# Patient Record
Sex: Female | Born: 1945 | Race: Black or African American | Hispanic: No | Marital: Single | Smoking: Never smoker
Health system: Southern US, Community
[De-identification: ages and names within clinical notes are randomized; demographics above are authoritative.]

## PROBLEM LIST (undated history)

## (undated) DIAGNOSIS — IMO0002 Reserved for concepts with insufficient information to code with codable children: Secondary | ICD-10-CM

## (undated) DIAGNOSIS — M329 Systemic lupus erythematosus, unspecified: Secondary | ICD-10-CM

## (undated) DIAGNOSIS — N186 End stage renal disease: Secondary | ICD-10-CM

## (undated) DIAGNOSIS — D649 Anemia, unspecified: Secondary | ICD-10-CM

---

## 1984-06-08 HISTORY — PX: ABDOMINAL HYSTERECTOMY: SHX81

## 2017-02-03 ENCOUNTER — Other Ambulatory Visit: Payer: Self-pay

## 2017-02-03 DIAGNOSIS — N185 Chronic kidney disease, stage 5: Secondary | ICD-10-CM

## 2017-02-18 ENCOUNTER — Encounter: Payer: Self-pay | Admitting: Vascular Surgery

## 2017-03-12 ENCOUNTER — Ambulatory Visit (HOSPITAL_COMMUNITY)
Admission: RE | Admit: 2017-03-12 | Discharge: 2017-03-12 | Disposition: A | Discharge status: Home / Self Care | Payer: Medicare Other | Source: Ambulatory Visit | Attending: Vascular Surgery | Admitting: Vascular Surgery

## 2017-03-12 ENCOUNTER — Encounter: Payer: Self-pay | Admitting: Vascular Surgery

## 2017-03-12 ENCOUNTER — Ambulatory Visit (INDEPENDENT_AMBULATORY_CARE_PROVIDER_SITE_OTHER): Payer: Medicare Other | Admitting: Vascular Surgery

## 2017-03-12 VITALS — BP 93/57 | HR 82 | Resp 16

## 2017-03-12 DIAGNOSIS — I70298 Other atherosclerosis of native arteries of extremities, other extremity: Secondary | ICD-10-CM | POA: Diagnosis not present

## 2017-03-12 DIAGNOSIS — N185 Chronic kidney disease, stage 5: Secondary | ICD-10-CM

## 2017-03-12 DIAGNOSIS — N186 End stage renal disease: Secondary | ICD-10-CM | POA: Diagnosis not present

## 2017-03-12 DIAGNOSIS — Z01818 Encounter for other preprocedural examination: Secondary | ICD-10-CM | POA: Diagnosis not present

## 2017-03-12 DIAGNOSIS — Z992 Dependence on renal dialysis: Secondary | ICD-10-CM | POA: Diagnosis not present

## 2017-03-12 NOTE — Progress Notes (Signed)
Requested by:  Corliss Parish, MD [address] [place], [place] [address]  Reason for consultation: New access   History of Present Illness   Abigail Buck is a 71 y.o. (1945-08-13) female who presents for evaluation for permanent access.  The patient is right hand dominant.  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  Previous central venous cannulation procedures include: LIJV TDC.  The patient has never had a PPM placed.   Past Medical History 1. HTN 2. CAD 3. HLD 4. CHF 5. CVA 6. Anxiety 7. Dementia 8. Lupus 9. ESRD 10. Hyperparathyroidism  Past Surgical History 1. PCI 2. Hysterectomy 3. LUA AVG 4. LUA AVG excision  Social History   Social History  . Marital status: Single    Spouse name: N/A  . Number of children: N/A  . Years of education: N/A   Occupational History  . Not on file.   Social History Main Topics  . Smoking status: Never Smoker  . Smokeless tobacco: Never Used  . Alcohol use No  . Drug use: No  . Sexual activity: No   Other Topics Concern  . Not on file   Social History Narrative  . No narrative on file    Family History  Problem Relation Age of Onset  . Family history unknown: Yes    Current Outpatient Prescriptions  Medication Sig Dispense Refill  . aspirin EC 81 MG tablet Take 81 mg by mouth daily.    . carvedilol (COREG) 6.25 MG tablet Take 6.25 mg by mouth 2 (two) times daily with a meal.    . cinacalcet (SENSIPAR) 30 MG tablet Take 30 mg by mouth daily with supper.    . clopidogrel (PLAVIX) 75 MG tablet Take 75 mg by mouth daily.    . ergocalciferol (VITAMIN D2) 50000 units capsule Take 50,000 Units by mouth once a week.    . hydroxychloroquine (PLAQUENIL) 200 MG tablet Take by mouth daily.    . midodrine (PROAMATINE) 10 MG tablet Take 10 mg by mouth daily.    . sevelamer carbonate (RENVELA) 800 MG tablet Take 800 mg by mouth 3 (three) times daily with meals.      No current facility-administered medications for this visit.     Allergies  Allergen Reactions  . Morphine Other (See Comments)    Opposite effect makes her agressive    REVIEW OF SYSTEMS (negative unless checked):   Cardiac:  []  Chest pain or chest pressure? [x]  Shortness of breath upon activity? []  Shortness of breath when lying flat? []  Irregular heart rhythm?  Vascular:  []  Pain in calf, thigh, or hip brought on by walking? []  Pain in feet at night that wakes you up from your sleep? []  Blood clot in your veins? []  Leg swelling?  Pulmonary:  []  Oxygen at home? []  Productive cough? []  Wheezing?  Neurologic:  [x]  Sudden weakness in arms or legs? []  Sudden numbness in arms or legs? []  Sudden onset of difficult speaking or slurred speech? []  Temporary loss of vision in one eye? []  Problems with dizziness?  Gastrointestinal:  []  Blood in stool? []  Vomited blood?  Genitourinary:  []  Burning when urinating? []  Blood in urine?  Psychiatric:  []  Major depression  Hematologic:  []  Bleeding problems? []  Problems with blood clotting?  Dermatologic:  []  Rashes or ulcers?  Constitutional:  []  Fever or chills?  Ear/Nose/Throat:  []  Change in hearing? []  Nose bleeds? []  Sore  throat?  Musculoskeletal:  []  Back pain? []  Joint pain? []  Muscle pain?   Physical Examination     Vitals:   03/12/17 1318  BP: (!) 93/57  Pulse: 82  Resp: 16  SpO2: 97%   There is no height or weight on file to calculate BMI.  General Alert, period of lucidity mixed with confusion, WD, Ill appearing, contracted in wheelchair  Head [place]/AT,    Ear/Nose/ Throat Hearing grossly intact, nares without erythema or drainage, oropharynx without Erythema or Exudate, Mallampati score: 3,   Eyes PERRLA, EOMI,    Neck Supple, mid-line trachea,    Pulmonary Sym exp, good B air movt, CTA B  Cardiac Irregularly, irregular rate and rhythm, no Murmurs, No rubs, No S3,S4  Vascular Vessel  Right Left  Radial Palpable Palpable  Brachial Palpable Palpable  Carotid Palpable, No Bruit Palpable, No Bruit  Aorta Not palpable N/A  Femoral Not palpable due to position in wheelchair Not palpable due to position in wheelchair  Popliteal Not palpable Not palpable  PT Not palpable Not palpable  DP Not palpable Not palpable    Gastro- intestinal soft, non-distended, non-tender to palpation, No guarding or rebound, no HSM, no masses, no CVAT B, No palpable prominent aortic pulse,    Musculo- skeletal R arm M/S 5/5, movements are dyscoordinated, Both legs partially contracted, L hand clawing with some mild contracture of L elbow  Neurologic Cranial nerves 2-12 grossly intact, Pain and light touch intact in extremities, Motor exam as listed above  Psychiatric Judgement not intact, Mood & affect consistent with cognitive dysfunction  Dermatologic See M/S exam for extremity exam, No rashes otherwise noted  Lymphatic  Palpable lymph nodes: None     Non-invasive Vascular Imaging   RUE Vein Mapping  (Date: 03/12/2017):   R arm: acceptable vein conduits include marginal upper arm basilic vein  LRUE Doppler (Date: 03/12/2017):   R arm:   Brachial: bi, 3.9 mm  Radial: mono, 2.0 mm  Ulnar: mono, 1.0 mm   Outside Studies/Documentation   20 pages of outside documents were reviewed including: outside hospital and nephrology charts.   Medical Decision Making   Abigail Buck is a 71 y.o. female who presents with end stage renal disease requiring HD, s/p CVA, dementia, debilitation   Based on vein mapping and examination, this patient's permanent access options include: R staged BVT.  I had an extensive discussion with this patient in regards to the nature of access surgery, including risk, benefits, and alternatives.    The patient is aware that the risks of access surgery include but are not limited to: bleeding, infection, steal syndrome, nerve damage, ischemic monomelic  neuropathy, failure of access to mature, complications related to venous hypertension, and possible need for additional access procedures in the future.  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  Given this is her only functioning hand, this obviously is a significant risk for deterioration of quality of life in the event clinically significant steal syndrome develops in the right hand.  The patient has decided to continued hemodialysis via her left internal jugular vein tunneled dialysis catheter.  She is aware of the increased rate of mortality of hemodialysis via a tunneled dialysis catheter.   Adele Barthel, MD, FACS [facility] Office: [phone] Pager: [phone]  03/12/2017, 4:29 PM

## 2017-10-04 ENCOUNTER — Inpatient Hospital Stay
Admission: AD | Admit: 2017-10-04 | Payer: Self-pay | Source: Other Acute Inpatient Hospital | Admitting: Internal Medicine

## 2017-10-08 HISTORY — PX: PACEMAKER IMPLANT: EP1218

## 2018-06-21 ENCOUNTER — Observation Stay (HOSPITAL_COMMUNITY)
Admission: EM | Admit: 2018-06-21 | Discharge: 2018-06-22 | Disposition: A | Discharge status: Skilled Nursing Facility | Payer: Medicare Other | Attending: Oncology | Admitting: Oncology

## 2018-06-21 ENCOUNTER — Encounter (HOSPITAL_COMMUNITY): Payer: Self-pay

## 2018-06-21 ENCOUNTER — Other Ambulatory Visit: Payer: Self-pay

## 2018-06-21 ENCOUNTER — Emergency Department (HOSPITAL_COMMUNITY): Payer: Medicare Other

## 2018-06-21 DIAGNOSIS — N186 End stage renal disease: Secondary | ICD-10-CM

## 2018-06-21 DIAGNOSIS — R079 Chest pain, unspecified: Secondary | ICD-10-CM | POA: Diagnosis not present

## 2018-06-21 DIAGNOSIS — R778 Other specified abnormalities of plasma proteins: Secondary | ICD-10-CM | POA: Insufficient documentation

## 2018-06-21 DIAGNOSIS — I471 Supraventricular tachycardia: Secondary | ICD-10-CM | POA: Insufficient documentation

## 2018-06-21 DIAGNOSIS — M329 Systemic lupus erythematosus, unspecified: Secondary | ICD-10-CM | POA: Insufficient documentation

## 2018-06-21 DIAGNOSIS — R072 Precordial pain: Secondary | ICD-10-CM | POA: Diagnosis not present

## 2018-06-21 DIAGNOSIS — Z79899 Other long term (current) drug therapy: Secondary | ICD-10-CM | POA: Diagnosis not present

## 2018-06-21 DIAGNOSIS — Z7982 Long term (current) use of aspirin: Secondary | ICD-10-CM | POA: Diagnosis not present

## 2018-06-21 DIAGNOSIS — Z992 Dependence on renal dialysis: Secondary | ICD-10-CM | POA: Insufficient documentation

## 2018-06-21 DIAGNOSIS — I69320 Aphasia following cerebral infarction: Secondary | ICD-10-CM

## 2018-06-21 DIAGNOSIS — I700 Atherosclerosis of aorta: Secondary | ICD-10-CM | POA: Insufficient documentation

## 2018-06-21 DIAGNOSIS — I4439 Other atrioventricular block: Secondary | ICD-10-CM

## 2018-06-21 DIAGNOSIS — Z950 Presence of cardiac pacemaker: Secondary | ICD-10-CM

## 2018-06-21 DIAGNOSIS — I959 Hypotension, unspecified: Secondary | ICD-10-CM | POA: Diagnosis not present

## 2018-06-21 DIAGNOSIS — Z993 Dependence on wheelchair: Secondary | ICD-10-CM

## 2018-06-21 DIAGNOSIS — R7989 Other specified abnormal findings of blood chemistry: Secondary | ICD-10-CM

## 2018-06-21 DIAGNOSIS — I442 Atrioventricular block, complete: Secondary | ICD-10-CM | POA: Diagnosis not present

## 2018-06-21 DIAGNOSIS — I452 Bifascicular block: Secondary | ICD-10-CM

## 2018-06-21 DIAGNOSIS — F0390 Unspecified dementia without behavioral disturbance: Secondary | ICD-10-CM | POA: Insufficient documentation

## 2018-06-21 DIAGNOSIS — R4701 Aphasia: Secondary | ICD-10-CM | POA: Insufficient documentation

## 2018-06-21 DIAGNOSIS — R0600 Dyspnea, unspecified: Secondary | ICD-10-CM | POA: Diagnosis present

## 2018-06-21 DIAGNOSIS — R0789 Other chest pain: Secondary | ICD-10-CM | POA: Diagnosis not present

## 2018-06-21 DIAGNOSIS — I120 Hypertensive chronic kidney disease with stage 5 chronic kidney disease or end stage renal disease: Secondary | ICD-10-CM | POA: Insufficient documentation

## 2018-06-21 DIAGNOSIS — IMO0002 Reserved for concepts with insufficient information to code with codable children: Secondary | ICD-10-CM | POA: Insufficient documentation

## 2018-06-21 DIAGNOSIS — I495 Sick sinus syndrome: Secondary | ICD-10-CM

## 2018-06-21 DIAGNOSIS — Z66 Do not resuscitate: Secondary | ICD-10-CM | POA: Insufficient documentation

## 2018-06-21 DIAGNOSIS — Z8673 Personal history of transient ischemic attack (TIA), and cerebral infarction without residual deficits: Secondary | ICD-10-CM | POA: Diagnosis not present

## 2018-06-21 DIAGNOSIS — Z885 Allergy status to narcotic agent status: Secondary | ICD-10-CM

## 2018-06-21 DIAGNOSIS — I69351 Hemiplegia and hemiparesis following cerebral infarction affecting right dominant side: Secondary | ICD-10-CM

## 2018-06-21 DIAGNOSIS — Z7902 Long term (current) use of antithrombotics/antiplatelets: Secondary | ICD-10-CM

## 2018-06-21 DIAGNOSIS — I480 Paroxysmal atrial fibrillation: Secondary | ICD-10-CM

## 2018-06-21 DIAGNOSIS — R110 Nausea: Secondary | ICD-10-CM | POA: Diagnosis present

## 2018-06-21 DIAGNOSIS — I4510 Unspecified right bundle-branch block: Secondary | ICD-10-CM | POA: Insufficient documentation

## 2018-06-21 HISTORY — DX: Reserved for concepts with insufficient information to code with codable children: IMO0002

## 2018-06-21 HISTORY — DX: Systemic lupus erythematosus, unspecified: M32.9

## 2018-06-21 LAB — I-STAT CHEM 8, ED
BUN: 15 mg/dL (ref 8–23)
CALCIUM ION: 0.92 mmol/L — AB (ref 1.15–1.40)
CREATININE: 5.3 mg/dL — AB (ref 0.44–1.00)
Chloride: 101 mmol/L (ref 98–111)
GLUCOSE: 95 mg/dL (ref 70–99)
HCT: 44 % (ref 36.0–46.0)
Hemoglobin: 15 g/dL (ref 12.0–15.0)
Potassium: 4.3 mmol/L (ref 3.5–5.1)
Sodium: 135 mmol/L (ref 135–145)
TCO2: 30 mmol/L (ref 22–32)

## 2018-06-21 LAB — I-STAT TROPONIN, ED: Troponin i, poc: 0.19 ng/mL (ref 0.00–0.08)

## 2018-06-21 LAB — TROPONIN I: TROPONIN I: 0.07 ng/mL — AB (ref <0.03)

## 2018-06-21 MED ORDER — ACETAMINOPHEN 650 MG RE SUPP: 650.0000 mg | Freq: Four times a day (QID) | RECTAL | Status: DC | PRN

## 2018-06-21 MED ORDER — HYDROXYCHLOROQUINE SULFATE 200 MG PO TABS
200.0000 mg | ORAL_TABLET | Freq: Every day | ORAL | Status: DC
  Administered 2018-06-22: 200 mg via ORAL
  Filled 2018-06-21: qty 1

## 2018-06-21 MED ORDER — HEPARIN SODIUM (PORCINE) 5000 UNIT/ML IJ SOLN
5000.0000 [IU] | Freq: Three times a day (TID) | INTRAMUSCULAR | Status: DC
  Administered 2018-06-21 – 2018-06-22 (×2): 5000 [IU] via SUBCUTANEOUS
  Filled 2018-06-21 (×2): qty 1

## 2018-06-21 MED ORDER — CLOPIDOGREL BISULFATE 75 MG PO TABS
75.0000 mg | ORAL_TABLET | Freq: Every day | ORAL | Status: DC
  Administered 2018-06-22: 75 mg via ORAL
  Filled 2018-06-21: qty 1

## 2018-06-21 MED ORDER — SEVELAMER CARBONATE 800 MG PO TABS
800.0000 mg | ORAL_TABLET | Freq: Three times a day (TID) | ORAL | Status: DC
  Administered 2018-06-22 (×2): 800 mg via ORAL
  Filled 2018-06-21 (×3): qty 1

## 2018-06-21 MED ORDER — ACETAMINOPHEN 325 MG PO TABS: 650.0000 mg | ORAL_TABLET | Freq: Four times a day (QID) | ORAL | Status: DC | PRN

## 2018-06-21 MED ORDER — CARVEDILOL 12.5 MG PO TABS: 6.2500 mg | ORAL_TABLET | Freq: Two times a day (BID) | ORAL | Status: DC

## 2018-06-21 MED ORDER — CINACALCET HCL 30 MG PO TABS: 30.0000 mg | ORAL_TABLET | Freq: Every day | ORAL | Status: DC

## 2018-06-21 MED ORDER — VITAMIN D (ERGOCALCIFEROL) 1.25 MG (50000 UNIT) PO CAPS: 50000.0000 [IU] | ORAL_CAPSULE | ORAL | Status: DC

## 2018-06-21 MED ORDER — ASPIRIN EC 81 MG PO TBEC
81.0000 mg | DELAYED_RELEASE_TABLET | Freq: Every day | ORAL | Status: DC
  Administered 2018-06-22: 81 mg via ORAL
  Filled 2018-06-21: qty 1

## 2018-06-21 NOTE — ED Provider Notes (Signed)
[facility]PART[facility] Provider Note   CSN: [id number] Arrival date & time: 06/21/18  1136     History   Chief Complaint Chief Complaint  Patient presents with  . Chest Pain    HPI Abigail Buck is a 73 y.o. female with a hx of ESRD on dialysis T/R/Sat, lupus, dementia, HTN, and pacemaker in place who presents to the ED via EMS for chest pain which started shortly PTA. Patient states she completed her dialysis tx when she developed chest pain. EMS was called, she was given 342mg  of ASA en route without much relief. Upon my assessment patient states she is chest pain free. She is denying any sxs. Denies alleviating/aggravating factors. Denies chest pain, dyspnea, nausea, vomiting, diaphoresis, or syncope. Her daughters at bedside states she will likely deny everything due to wanting to go home and not be here. Her medical hx is not entirely clear, they report she has a pacemaker due to some type of lab abnormality and bradycardia. They do not believe that she has every had any type of CAD. Level 5 caveat secondary to dementia.   Patient is DNI/DNR. She resides in a nursing home facility.   Per care everywhere chart review: Patient receives care from cardiologist Dr. Deno Etienne w/ [facility]. She has a hx of paroxysmal atrial tachycardia, hypertension and pacemaker implantation secondary to complete heart block.   HPI  Past Medical History:  Diagnosis Date  . Chronic kidney disease     Patient Active Problem List   Diagnosis Date Noted  . ESRD on dialysis [facility]) 03/12/2017    Past Surgical History:  Procedure Laterality Date  . ABDOMINAL HYSTERECTOMY  1986     OB History   No obstetric history on file.      Home Medications    Prior to Admission medications   Medication Sig Start Date End Date Taking? Authorizing Provider  aspirin EC 81 MG tablet Take 81 mg by mouth daily.    [provider]  carvedilol (COREG) 6.25 MG tablet Take 6.25 mg by  mouth 2 (two) times daily with a meal.    [provider]  cinacalcet (SENSIPAR) 30 MG tablet Take 30 mg by mouth daily with supper.    [provider]  clopidogrel (PLAVIX) 75 MG tablet Take 75 mg by mouth daily.    [provider]  ergocalciferol (VITAMIN D2) 50000 units capsule Take 50,000 Units by mouth once a week.    [provider]  hydroxychloroquine (PLAQUENIL) 200 MG tablet Take by mouth daily.    [provider]  midodrine (PROAMATINE) 10 MG tablet Take 10 mg by mouth daily.    [provider]  sevelamer carbonate (RENVELA) 800 MG tablet Take 800 mg by mouth 3 (three) times daily with meals.    [provider]    Family History Family History  Family history unknown: Yes    Social History Social History   Tobacco Use  . Smoking status: Never Smoker  . Smokeless tobacco: Never Used  Substance Use Topics  . Alcohol use: No  . Drug use: No     Allergies   Morphine   Review of Systems Review of Systems  Unable to perform ROS: Dementia  Cardiovascular: Positive for chest pain.     Physical Exam Updated Vital Signs Pulse 96   Temp 98.2 F (36.8 C) (Oral)   Resp 16   Ht 5\' 5"  (1.651 m)   Wt 75.8 kg  SpO2 96%   BMI 27.79 kg/m   Physical Exam Vitals signs and nursing note reviewed.  Constitutional:      General: She is not in acute distress.    Appearance: She is well-developed. She is not toxic-appearing.  HENT:     Head: Normocephalic and atraumatic.  Eyes:     General:        Right eye: No discharge.        Left eye: No discharge.     Conjunctiva/sclera: Conjunctivae normal.  Neck:     Musculoskeletal: Neck supple.  Cardiovascular:     Rate and Rhythm: Normal rate and regular rhythm.  Pulmonary:     Effort: Pulmonary effort is normal. No respiratory distress.     Breath sounds: Normal breath sounds. No wheezing, rhonchi or rales.  Chest:     Comments: Pacemaker & dialysis catheter  in place to anterior chest wall. No surrounding erythema, warmth, or drainage.  Abdominal:     General: There is no distension.     Palpations: Abdomen is soft.     Tenderness: There is no abdominal tenderness.  Musculoskeletal:     Right lower leg: She exhibits no tenderness. No edema.     Left lower leg: She exhibits no tenderness. No edema.     Comments: Skin to LEs is pale- baseline per family.   Skin:    General: Skin is warm and dry.     Findings: No rash.  Neurological:     Mental Status: She is alert.     Comments: Clear speech.   Psychiatric:        Behavior: Behavior normal.    ED Treatments / Results  Labs (all labs ordered are listed, but only abnormal results are displayed) Labs Reviewed  I-STAT CHEM 8, ED - Abnormal; Notable for the following components:      Result Value   Creatinine, Ser 5.30 (*)    Calcium, Ion 0.92 (*)    All other components within normal limits  I-STAT TROPONIN, ED - Abnormal; Notable for the following components:   Troponin i, poc 0.19 (*)    All other components within normal limits    EKG None    Radiology Dg Chest Port 1 View  Result Date: 06/21/2018 CLINICAL DATA:  Generalized chest pain that began while the patient was undergoing hemodialysis earlier this morning. EXAM: PORTABLE CHEST 1 VIEW COMPARISON:  None. FINDINGS: Cardiac silhouette mildly enlarged. Apparent diagonal branch of LAD coronary stent. Dense mitral annular calcification. Thoracic aorta atherosclerotic. Calcified LEFT hilar and LEFT-sided mediastinal lymph nodes. Calcified granuloma at the LEFT lung base. Lungs clear. Pulmonary vascularity normal. No visible pleural effusions. RIGHT subclavian dual lead transvenous pacemaker. LEFT jugular dialysis catheter tips project over the RIGHT atrium. IMPRESSION: Mild cardiomegaly. No acute cardiopulmonary disease. Old granulomatous disease. Electronically Signed   By: Evangeline Dakin M.D.   On: 06/21/2018 13:19     Procedures Procedures (including critical care time)  Medications Ordered in ED Medications - No data to display   Initial Impression / Assessment and Plan / ED Course  I have reviewed the triage vital signs and the nursing notes.  Pertinent labs & imaging results that were available during my care of the patient were reviewed by me and considered in my medical decision making (see chart for details).   Patient with history of ESRD on dialysis, pacemaker in place secondary to complete heart block, and paroxysmal atrial tachycardia presents to the ED via EMS  for chest pain.  Patient states she is chest pain-free at this point, however history is somewhat difficult secondary to dementia and family stating patient likely will not tell the truth secondary to wanting to go home.  Patient's daughters are at bedside who act as medical power of attorney for the patient.  Her EKG does not seem to have acute change upon review of dictation of prior EKGs. Her I-stat troponin is elevated at 0.19, this is in the setting of ESRD and difficult history, no old on record for comparison. I-stat chem 8 consistent with ESRD. Per RN difficult stick, trouble obtaining enough blood for full labs hence I-stat utilization. Pending CXR.   Discussed with Dr. Lita Mains- will obtain CXR and plan for cardiology consultation.   CXR with mild cardiomegaly. No acute cardiopulmonary disease.   Discussed results & plan with patient and family at bedside thus far, opportunity for questions, confirmed understanding and are in agreement.   13:31: CONSULT: Discussed with Trish- cards master- cardiology team will consult, patient will need medical admission given on dialysis.   14:22: CONSULT: Discussed case with IM teaching service- accept admission.   Final Clinical Impressions(s) / ED Diagnoses   Final diagnoses:  Chest pain, unspecified type    ED Discharge Orders    None       Amaryllis Dyke,  PA-C 06/21/18 1450    Julianne Rice, MD 06/21/18 1517

## 2018-06-21 NOTE — ED Triage Notes (Signed)
Pt arrives to ED from dialysis center after receiving treatment with complaints of chest pain since about an hour ago. EMS reports pt was given 324 asa en route with no change in cp. Pt has pacemaker. Pt placed in position of comfort with bed locked and lowered, call bell in reach.

## 2018-06-21 NOTE — H&P (Signed)
Date: 06/21/2018               Patient Name:  Abigail Buck MRN: [id number]  DOB: [birth year] Age / Sex: 73 y.o., female   PCP: Patient, No Pcp Per         Medical Service: Internal Medicine Teaching Service         Attending Physician: Dr. Annia Belt, MD    First Contact: Dr. Annie Paras Pager: [phone]  Second Contact: Dr. 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 The pain started when she was sitting in her wheelchair waiting for transport back to her nursing facility. She had completed her full dialysis session and she has not missed any sessions recently. She was not exerting herself nor was she agitated. The pain was non-radiating and was associated with nausea and dyspnea. She denies vomiting or diaphoresis. The pain resolved spontaneously after 20 minutes. The pain returned while she was in the ambulance for EMS. She received aspirin with EMS. The pain again resolved and now she is pain free and breathing comfortably on room air. She denies fevers, chills, headaches, abdominal pain, and lower extremity swelling. She has never felt chest pain like this before. She has a history of arrhythmias with pacemaker placement, but has no history of CAD or ischemic heart disease. The patient's adult daughters were present at bedside and assisted with the history.   Upon arrival to the ED, patient was afebrile and hemodynamically stable. I-Stat chem 8 and troponin were collected. Chem panel showed Cr of 5.3. Trop positive to 0.19. EKG  was NSR with LAD and RBBB. CXR shows calcification of vessels without pulmonary edema.   Meds:  Aspirin 81mg  daily Coreg 6.25mg  daily BID Cinacalcet 30mg  daily Clopidogrel 75mg  daily Vitamin D2 50,000 units weekly Plaquenil 200mg  daily Midodrine 10mg  daily Sevelamer 800mg  daily  Allergies: Allergies as of 06/21/2018 - Review Complete 06/21/2018  Allergen Reaction Noted  . Morphine Other (See Comments) 01/27/2017   Past Medical History:  Diagnosis Date  . Chronic kidney disease    Past Surgical History:  Procedure Laterality Date  . ABDOMINAL HYSTERECTOMY  1986    Family History: No family history of heart disease.  Social History: Lives in a nursing facility. 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 Never smoker. Denies etoh and illicit drug use. Wheelchair bound.   Review of Systems: A complete ROS was negative except as per HPI.   Physical Exam: Blood pressure 113/76, pulse 89, temperature 98.2 F (36.8 C), temperature source Oral, resp. rate 18, height 5\' 5"  (1.651 m), weight 75.8 kg, SpO2 97 %.  Constitutional: Obese woman. No distress. Alert to person, but not to place ([place]) or time (2016) Eyes: Pupils are equal, round, and reactive to light. EOM are normal.  Cardiovascular: Normal rate and regular rhythm. No murmurs, rubs, or gallops. Pulmonary/Chest: Effort normal. Clear to auscultation bilaterally. No wheezes, rales, or rhonchi. Tender to palpation over the mid chest. Tunneled dialysis cath over the left  chest. Abdominal: Bowel sounds present. Soft, non-distended, non-tender. Ext: No lower extremity edema. Skin: Warm and dry. No rashes or wounds.  EKG: personally reviewed my interpretation is NSR with LAD and RBBB.   CXR: personally reviewed my interpretation is calcification of vessels without pulmonary edema.   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 I-stat troponin was elevated to 0.19.   Chest Pain - Hx of atrial tachycardia and complete heart block s/p pacemaker in 09/2017 - 09/2017 ECHO showed EF of 65-70% with grade 1 diastolic dysfunction and no valvular disease - Patient likely has undiagnosed CAD given vascular calcifications seen on CXR and recent abdominal CT (09/2017). She has never had an MI or cardiac cath before. However, both the patient and daughter agree that she does not want invasive measures including cardiac catheterization during this admission. - No overt ischemic changes on EKG, but no prior for comparison. Plan - Tele - Continue aspirin 81mg  daily - Trend troponins - Repeat EKG   ESRD on HD TTS - Last dialysis 1/14. Completed session. No recent missed sessions. - If patient is expected to miss her next dialysis session on 1/16 because she is admitted, nephrology will see her and plan for inpatient HD   Hypotension - Patient is on midodrine 10mg  daily at home and Coreg 6.25mg  BID. Bps have ranged from the 902X-115Z systolic since presentation.  Plan - Hold midodrine for now. Resume if blood pressures become stable and soft. - Hold Coreg  SLE - No signs of acute flare at this time - Continue Plaquenil 200mg  daily  CVA - Continue aspirin 81mg  and plavix 75mg  daily  FEN: No IV fluids, renal diet, replace electrolytes as needed  DVT ppx: [place] heparin Code status: DNR/DNI  Dispo: Admit patient to Observation with expected length of stay less than 2 midnights.  Signed: Corinne Ports, MD 06/21/2018, 5:19 PM  Pager: [phone]

## 2018-06-21 NOTE — ED Notes (Signed)
Positive trop results given to Dr. Lita Mains and Jinny Blossom, RN

## 2018-06-21 NOTE — Consult Note (Addendum)
Cardiology Consultation:   Patient ID: Abigail Buck; [id number]; [birth year]   Admit date: 06/21/2018 Date of Consult: 06/21/2018  Primary Care Provider: Patient, No Pcp Per Primary Cardiologist: Dr. Deno Etienne- [facility]   Patient Profile:   Abigail Buck is a 73 y.o. 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 Charlyne Quale.  History of Present Illness:   Abigail Buck is a 73yo F with a hx as stated above who presented to [facility] from HD on 06/21/2018 with complaints of chest pain that began approximately one hour after the completion of HD. History is difficult to obtain secondary to dementia, however patients daughters are at bedside to help assist. Pt reports that she completed her usual HD treatment and was getting ready to be transported back to her SNF when she reports an acute onset of mid sternal chest pain without radiation or associated symptoms. She states that the pain was constant and dissipated on its own. Given her sympotms EMS was called for transport to the ED for further evaluation.   She was given ASA 324mg  en route per EMS. Upon ED arrival, the patient was chest pain free. She denies associated symptoms such as N/V, diaphoresis, dizziness, palpitations, SOB, or syncope. She denies chest pain radiation to her arms, neck or back.  She has no hx of tobacco, alcohol or illicit drug use. She denies previous chest pain symptoms and has no prior hx of CAD. At baseline, she is wheelchair bound after CVA and lives at a skilled nursing facility. She has been on HD since 2007. Pt is a DNR/DNI. She is followed by cardiology out of [facility] system. She was last seen in follow up for CHB s/p PPM placement and was doing fairly well from a heart perspective. Her last echocardiogram from 09/2017 showed normal LV  function.   In the ED, troponin found to be elevated at 0.19. Creatinine is elevated at 5.30. CXR was negative for acute changes. EKG with paced rhythm and RBBB with T wave abnormality with no comparison tracing on file.   Cardiology has been consulted for further evaluation.   Past Medical History:  Diagnosis Date  . Chronic kidney disease     Past Surgical History:  Procedure Laterality Date  . ABDOMINAL HYSTERECTOMY  1986     Prior to Admission medications   Medication Sig Start Date End Date Taking? Authorizing Provider  aspirin EC 81 MG tablet Take 81 mg by mouth daily.    [provider]  carvedilol (COREG) 6.25 MG tablet Take 6.25 mg by mouth 2 (two) times daily with a meal.    [provider]  cinacalcet (SENSIPAR) 30 MG tablet Take 30 mg by mouth daily with supper.    [provider]  clopidogrel (PLAVIX) 75 MG tablet Take 75 mg by mouth daily.    [provider]  ergocalciferol (VITAMIN D2) 50000 units capsule Take 50,000 Units by mouth once a week.    [provider]  hydroxychloroquine (PLAQUENIL) 200 MG tablet Take by mouth daily.    [provider]  midodrine (PROAMATINE) 10 MG tablet Take 10 mg by mouth daily.    [provider]  sevelamer carbonate (RENVELA) 800 MG tablet Take 800 mg by mouth 3 (three) times daily with meals.  [provider]   Inpatient Medications: Scheduled Meds:  Continuous Infusions:  PRN Meds:  Allergies:    Allergies  Allergen Reactions  . Morphine Other (See Comments)    Opposite effect makes her agressive    Social History:   Social History   Socioeconomic History  . Marital status: Single    Spouse name: Not on file  . Number of children: Not on file  . Years of education: Not on file  . Highest education level: Not on file  Occupational History  . Not on file  Social Needs  . Financial resource strain: Not on file  . Food insecurity:    Worry:  Not on file    Inability: Not on file  . Transportation needs:    Medical: Not on file    Non-medical: Not on file  Tobacco Use  . Smoking status: Never Smoker  . Smokeless tobacco: Never Used  Substance and Sexual Activity  . Alcohol use: No  . Drug use: No  . Sexual activity: Never  Lifestyle  . Physical activity:    Days per week: Not on file    Minutes per session: Not on file  . Stress: Not on file  Relationships  . Social connections:    Talks on phone: Not on file    Gets together: Not on file    Attends religious service: Not on file    Active member of club or organization: Not on file    Attends meetings of clubs or organizations: Not on file    Relationship status: Not on file  . Intimate partner violence:    Fear of current or ex partner: Not on file    Emotionally abused: Not on file    Physically abused: Not on file    Forced sexual activity: Not on file  Other Topics Concern  . Not on file  Social History Narrative  . Not on file    Family History:   Family History  Family history unknown: Yes   Family Status:  No family status information on file.   ROS:  Please see the history of present illness.  All other ROS reviewed and negative.     Physical Exam/Data:   Vitals:   06/21/18 1139 06/21/18 1140  Pulse: 96   Resp: 16   Temp: 98.2 F (36.8 C)   TempSrc: Oral   SpO2: 96%   Weight:  75.8 kg  Height:  5\' 5"  (1.651 m)   No intake or output data in the 24 hours ending 06/21/18 1344 Filed Weights   06/21/18 1140  Weight: 75.8 kg   Body mass index is 27.79 kg/m.   General: Elderly, NAD Skin: Warm, dry, intact  Head: Normocephalic, atraumatic, clear, moist mucus membranes. Neck: Negative for carotid bruits. No JVD Lungs:Clear to ausculation bilaterally. No wheezes, rales, or rhonchi. Breathing is unlabored. Cardiovascular: RRR with S1 S2. No murmurs, rubs, gallops, or LV heave appreciated. Abdomen: Soft, non-tender, non-distended with  normoactive bowel sounds.No obvious abdominal masses. MSK: Strength and tone appear normal for age. 5/5 in all extremities Extremities: No edema. No clubbing or cyanosis. DP/PT pulses 2+ bilaterally Neuro: Alert and oriented. No focal deficits. No facial asymmetry. MAE spontaneously. Psych: Responds to questions appropriately with normal affect.     EKG:  The EKG was personally reviewed and demonstrates: 06/21/2018 NSR with pacing, RBBB, LVH and T wave abnormality in leads V1-V2 Telemetry:  Telemetry was personally reviewed and demonstrates: 06/21/2018 NSR with pacing,  no acute changes noted   Relevant CV Studies:  ECHO: 10/05/2017: Interpretation Summary A complete portable two-dimensional transthoracic echocardiogram with color flow Doppler and Spectral Doppler was performed.  The left ventricle is normal in size. There is mild concentric left ventricular hypertrophy with normal wall motion and ejection fraction 65-70%. Grade I mild diastolic dysfunction; abnormal relaxation pattern. The right ventricle is grossly normal in size and function. There is mild mitral annular calcification. Moderate aortic sclerosis is present with good valvular opening. There is a mild aortic valve gradient noted. There is no pericardial effusion.  Left Ventricle The left ventricle is normal in size. There is mild concentric left ventricular hypertrophy with normal wall motion and ejection fraction 65- 70%. Grade I mild diastolic dysfunction; abnormal relaxation pattern.  Right Ventricle The right ventricle is grossly normal in size and function.  Atria The left and right atria are normal size. IVC not seen.  Mitral Valve There is mild mitral annular calcification. There is trace mitral regurgitation.  Tricuspid Valve The tricuspid valve is grossly normal. There is trace tricuspid regurgitation. Estimation of right ventricular systolic pressure is not possible.  Aortic Valve The aortic  valve is trileaflet. Moderate aortic sclerosis is present with good valvular opening. There is a mild aortic valve gradient noted. There is no aortic regurgitation present.  Pulmonic Valve The pulmonic valve is not well seen, but is grossly normal. There is no pulmonic regurgitation.  Vessels The aortic root is normal in diameter.  Pericardium There is no pericardial effusion.  CATH: None   Laboratory Data:  Chemistry Recent Labs  Lab 06/21/18 1203  NA 135  K 4.3  CL 101  GLUCOSE 95  BUN 15  CREATININE 5.30*    No results found for: PROT, ALBUMIN, AST, ALT, ALKPHOS, BILITOT Hematology Recent Labs  Lab 06/21/18 1203  HGB 15.0  HCT 44.0   Cardiac EnzymesNo results for input(s): TROPONINI in the last 168 hours.  Recent Labs  Lab 06/21/18 1201  TROPIPOC 0.19*    BNPNo results for input(s): BNP, PROBNP in the last 168 hours.  DDimer No results for input(s): DDIMER in the last 168 hours. TSH: No results found for: TSH Lipids:No results found for: CHOL, HDL, LDLCALC, LDLDIRECT, TRIG, CHOLHDL HgbA1c:No results found for: HGBA1C  Radiology/Studies:  Dg Chest Port 1 View  Result Date: 06/21/2018 CLINICAL DATA:  Generalized chest pain that began while the patient was undergoing hemodialysis earlier this morning. EXAM: PORTABLE CHEST 1 VIEW COMPARISON:  None. FINDINGS: Cardiac silhouette mildly enlarged. Apparent diagonal branch of LAD coronary stent. Dense mitral annular calcification. Thoracic aorta atherosclerotic. Calcified LEFT hilar and LEFT-sided mediastinal lymph nodes. Calcified granuloma at the LEFT lung base. Lungs clear. Pulmonary vascularity normal. No visible pleural effusions. RIGHT subclavian dual lead transvenous pacemaker. LEFT jugular dialysis catheter tips project over the RIGHT atrium. IMPRESSION: Mild cardiomegaly. No acute cardiopulmonary disease. Old granulomatous disease. Electronically Signed   By: Evangeline Dakin M.D.   On: 06/21/2018 13:19    Assessment and Plan:   1. Atypical chest pain with no prior hx of CAD: -Pt presented after one episode of chest pain without radiation or associated symptoms which occurred approximately one hour after HD completion earlier today  -Trop, 0.19 today, likely in the setting of ESRD versus demand ischemia >>continue to trend and see plan below   -EKG with paced rhythm, RBBB and T wave inversions in leads V1-V2 with no comparison tracings. There is mention of left axis deviation, RBBB, LVH and  T wave abnormality from EKG performed at [facility] 10/2017 in the setting of PPM placement  -Last echocardiogram from 10/05/2017 with LVEF of 65-70% with no valvular disease -CXR with cardiomegaly and no acute cardiopulmonary disease  -Continue ASA, home carvedilol when BP more stable  -Will likely not pursue further ischemic evaluation with cardiac catheterization in the setting of age, chronic illness with ESRD and absence of symptoms  -If marked change in status such markedly elevated trop, EKG changes or acute chest pain, could consider stress test  -Conservative management for now   2. ESRD: -HD T/R/Sat -Creatinine, 5.30 today  -Trend BMET  3. Essential HTN: -Stable, 113/76>107/74>112/74 -Continue current medications  -Pt is on home midodrine   4. Hx of PAT: -PPM in place -Episodes documented as infrequent per chart review from cardiology note   5. Hx of CHB s/p PPM placement: -Follows with Dr. Deno Etienne with [facility] functioning normally. Batt 13 years, lead trends and thresholds WNL; Episodes: 2 AT/AF - PAT, 1 NSVT - 9 beats -Last Echo 10/05/17 EF 65-70%>>>no need to repeat at this time   6. Dementia: -Pt daughter reports memory with waxing and waning -Unable to give full HPI without daughters assistance   -Currently on Remeron    For questions or updates, please contact [facility] Please consult [email or web address] for contact info under Cardiology/STEMI.   Lyndel Safe NP-C [facility] Pager: [phone] 06/21/2018 1:44 PM  I have personally seen and examined this patient with Kathyrn Drown. I have edited the note where necessary. I agree with the assessment and plan as outlined above. Abigail Buck has no known CAD. She has a pacemaker in place. This is followed by cardiology in [facility]. She has lupus and ESRD on HD. She has been on HD for 13 years. She has been living in a SNF since may 2019. She has dementia and is confined to a wheelchair. Her daughters are both here today. One episode of chest pain following HD today. No chest pain. EKG reviewed by me and shows sinus, RBBB. LVH. Echo from April 2019 with LVEF=65-70%. Troponin mildly elevated at 0.19.   My exam: General: Well developed, well nourished, NAD  HEENT: OP clear, mucus membranes moist  SKIN: warm, dry. No rashes. Neuro: No focal deficits  Musculoskeletal: Muscle strength 5/5 all ext  Psychiatric: Mood and affect normal  Neck: No JVD, no carotid bruits, no thyromegaly, no lymphadenopathy.  Lungs:Clear bilaterally, no wheezes, rhonci, crackles Cardiovascular: Regular rate and rhythm. No murmurs, gallops or rubs. Abdomen:Soft. Bowel sounds present. Non-tender.  Extremities: No lower extremity edema. Pulses are 2 + in the bilateral DP/PT.  Plan: Chest pain in a patient with advanced dementia. She lives in a SNF. She is ESRD on HD. No active chest pain now. It is not surprising to see a mild troponin elevation in the setting of ESRD. Given her many comorbidities, I would prefer a conservative approach to her cardiac evaluation. Her daughters agree with this.  -Cycle troponin.  -We will follow up in the am.   Lauree Chandler 06/21/2018 3:34 PM

## 2018-06-21 NOTE — ED Notes (Signed)
Patient denies any chest pain at this time.

## 2018-06-21 NOTE — ED Notes (Signed)
Pt is resting and appears comfortable.  Daughters at bedside.  Pt denies any cp, SOB, or lightheadedness.  She wants something to eat.  She is waiting for a hospital room.

## 2018-06-21 NOTE — Progress Notes (Signed)
Pt. Troponin 0.07. Internal Med paged to make aware.

## 2018-06-22 ENCOUNTER — Encounter (HOSPITAL_COMMUNITY): Payer: Self-pay | Admitting: General Practice

## 2018-06-22 DIAGNOSIS — I120 Hypertensive chronic kidney disease with stage 5 chronic kidney disease or end stage renal disease: Secondary | ICD-10-CM | POA: Diagnosis not present

## 2018-06-22 DIAGNOSIS — R072 Precordial pain: Secondary | ICD-10-CM | POA: Diagnosis not present

## 2018-06-22 DIAGNOSIS — R0789 Other chest pain: Secondary | ICD-10-CM | POA: Diagnosis not present

## 2018-06-22 DIAGNOSIS — I959 Hypotension, unspecified: Secondary | ICD-10-CM | POA: Diagnosis not present

## 2018-06-22 DIAGNOSIS — N186 End stage renal disease: Secondary | ICD-10-CM | POA: Diagnosis not present

## 2018-06-22 DIAGNOSIS — I442 Atrioventricular block, complete: Secondary | ICD-10-CM

## 2018-06-22 DIAGNOSIS — Z992 Dependence on renal dialysis: Secondary | ICD-10-CM | POA: Diagnosis not present

## 2018-06-22 DIAGNOSIS — I4510 Unspecified right bundle-branch block: Secondary | ICD-10-CM

## 2018-06-22 DIAGNOSIS — Z8673 Personal history of transient ischemic attack (TIA), and cerebral infarction without residual deficits: Secondary | ICD-10-CM

## 2018-06-22 DIAGNOSIS — R079 Chest pain, unspecified: Secondary | ICD-10-CM | POA: Diagnosis not present

## 2018-06-22 LAB — MRSA PCR SCREENING: MRSA by PCR: NEGATIVE

## 2018-06-22 LAB — TROPONIN I: TROPONIN I: 0.07 ng/mL — AB (ref <0.03)

## 2018-06-22 NOTE — Clinical Social Work Note (Signed)
Clinical Social Work Assessment  Patient Details  Name: Abigail Buck MRN: [id number] Date of Birth: 1946/04/11  Date of referral:  06/22/18               Reason for consult:  Discharge Planning                Permission sought to share information with:  Facility Sport and exercise psychologist, Family Supports Permission granted to share information::     Name::     Chiropractor::     Relationship::  Daughter  Contact Information:     Housing/Transportation Living arrangements for the past 2 months:  [place] of Information:  Patient, Medical Team, Adult Children, Facility Patient Interpreter Needed:  None Criminal Activity/Legal Involvement Pertinent to Current Situation/Hospitalization:  No - Comment as needed Significant Relationships:  Adult Children Lives with:  Facility Resident Do you feel safe going back to the place where you live?  Yes Need for family participation in patient care:  Yes (Comment)  Care giving concerns:  Patient is a long-term resident at [facility].   Social Worker assessment / plan:  Patient not fully oriented. Daughter Abigail Buck at bedside. CSW introduced role and explained that discharge planning would be discussed. Patient and daughter confirmed she is a long-term resident at [facility]. Plan is to return today via [facility]. No further concerns. CSW encouraged patient's daughter to contact CSW as needed. CSW will continue to follow patient for support and facilitate discharge back to SNF today.  Employment status:  Retired Forensic scientist:  Medicare PT Recommendations:  Not assessed at this time [place] / Referral to community resources:  [place]  Patient/Family's Response to care:  Patient and her daughter agreeable to return to SNF today. Patient's daughters supportive and involved in patient's care. Patient and her daughter appreciated social work intervention.  Patient/Family's Understanding of  and Emotional Response to Diagnosis, Current Treatment, and Prognosis:  Patient not fully oriented. Patient's daughter has a good understanding of the reason for admission and plan to return to SNF today. Patient and her daughter appear happy with hospital care.  Emotional Assessment Appearance:  Appears stated age Attitude/Demeanor/Rapport:  Engaged Affect (typically observed):  Accepting, Pleasant Orientation:  Oriented to Self, Oriented to Place Alcohol / Substance use:  Never Used Psych involvement (Current and /or in the community):  No (Comment)  Discharge Needs  Concerns to be addressed:  Care Coordination Readmission within the last 30 days:  No Current discharge risk:  None Barriers to Discharge:  No Barriers Identified   Candie Chroman, LCSW 06/22/2018, 12:37 PM

## 2018-06-22 NOTE — NC FL2 (Signed)
[place] MEDICAID FL2 LEVEL OF CARE SCREENING TOOL     IDENTIFICATION  Patient Name: Abigail Buck Birthdate: Nov 18, 1945 Sex: female Admission Date (Current Location): 06/21/2018  [facility] and [place] Number:  Herbalist and Address:  [facility] [place] [place] [address], [place], [place] [address]      Provider Number: [id number]  Attending Physician Name and Address:  Annia Belt, MD  Relative Name and Phone Number:       Current Level of Care: Hospital Recommended Level of Care: [place] Prior Approval Number:    Date Approved/Denied:   PASRR Number: [id number]  Discharge Plan: SNF    Current Diagnoses: Patient Active Problem List   Diagnosis Date Noted  . Chest pain 06/21/2018  . Lupus ([place])   . Elevated troponin   . Artificial pacemaker   . Right bundle branch block   . ESRD (end stage renal disease) on dialysis ([place]) 03/12/2017    Orientation RESPIRATION BLADDER Height & Weight     Self, Place  Normal Continent Weight: 169 lb 8.5 oz (76.9 kg) Height:  5\' 5"  (165.1 cm)  BEHAVIORAL SYMPTOMS/MOOD NEUROLOGICAL BOWEL NUTRITION STATUS  (None) (None) Continent Diet(Renal)  AMBULATORY STATUS COMMUNICATION OF NEEDS Skin   Total Care Verbally Normal                       Personal Care Assistance Level of Assistance              Functional Limitations Info  Sight, Hearing, Speech Sight Info: Adequate Hearing Info: Adequate Speech Info: Adequate    SPECIAL CARE FACTORS FREQUENCY                       Contractures Contractures Info: Not present    Additional Factors Info  Code Status, Allergies Code Status Info: DNR Allergies Info: Morphine           Current Medications (06/22/2018):  This is the current hospital active medication list Current Facility-Administered Medications  Medication Dose Route Frequency Provider Last Rate Last Dose  . acetaminophen (TYLENOL) tablet 650 mg   650 mg Oral Q6H PRN Ina Homes, MD       Or  . acetaminophen (TYLENOL) suppository 650 mg  650 mg Rectal Q6H PRN Ina Homes, MD      . aspirin EC tablet 81 mg  81 mg Oral Daily Ina Homes, MD   81 mg at 06/22/18 0934  . cinacalcet (SENSIPAR) tablet 30 mg  30 mg Oral Q supper Helberg, Justin, MD      . clopidogrel (PLAVIX) tablet 75 mg  75 mg Oral Daily Helberg, Justin, MD   75 mg at 06/22/18 0934  . heparin injection 5,000 Units  5,000 Units Subcutaneous Q8H Ina Homes, MD   5,000 Units at 06/22/18 3474  . hydroxychloroquine (PLAQUENIL) tablet 200 mg  200 mg Oral Daily Ina Homes, MD   200 mg at 06/22/18 0934  . sevelamer carbonate (RENVELA) tablet 800 mg  800 mg Oral TID WC Helberg, Justin, MD   800 mg at 06/22/18 1230  . [START ON 06/27/2018] Vitamin D (Ergocalciferol) (DRISDOL) capsule 50,000 Units  50,000 Units Oral Weekly Ina Homes, MD         Discharge Medications: Please see discharge summary for a list of discharge medications.  Relevant Imaging Results:  Relevant Lab Results:   Additional Information SS#: [id number]. HD TTS.  Candie Chroman,  LCSW

## 2018-06-22 NOTE — Plan of Care (Signed)
  Problem: Safety: Goal: Ability to remain free from injury will improve Outcome: Progressing   

## 2018-06-22 NOTE — Discharge Summary (Signed)
Name: Abigail Buck MRN: [id number] DOB: January 16, 1946 73 y.o. PCP: Patient, No Pcp Per  Date of Admission: 06/21/2018 11:36 AM Date of Discharge: 06/22/2018 Attending Physician: Dr. Beryle Beams  Discharge Diagnosis: 1. Chest pain 2. ESRD on HD TTS  Discharge Medications: Allergies as of 06/22/2018      Reactions   Morphine Other (See Comments)   Opposite effect makes her agressive      Medication List    TAKE these medications   aspirin EC 81 MG tablet Take 81 mg by mouth daily.   carvedilol 6.25 MG tablet Commonly known as:  COREG Take 6.25 mg by mouth 2 (two) times daily with a meal.   cinacalcet 30 MG tablet Commonly known as:  SENSIPAR Take 30 mg by mouth daily with supper.   clopidogrel 75 MG tablet Commonly known as:  PLAVIX Take 75 mg by mouth daily.   ergocalciferol 1.25 MG (50000 UT) capsule Commonly known as:  VITAMIN D2 Take 50,000 Units by mouth once a week.   hydroxychloroquine 200 MG tablet Commonly known as:  PLAQUENIL Take by mouth daily.   midodrine 10 MG tablet Commonly known as:  PROAMATINE Take 10 mg by mouth daily.   sevelamer carbonate 800 MG tablet Commonly known as:  RENVELA Take 800 mg by mouth 3 (three) times daily with meals.       Disposition and follow-up:   [name] was discharged from [facility] [place] [facility] in Good condition.  At the hospital follow up visit please address:  1. Chest pain - Troponins downtrended from 0.19 --> 0.07 --> 0.07 - EKG showed NSR with LAD and RBBB. No changes on repeat EKG. - Patient did not want any invasive tests including cardiac cath.  - Continued home aspirin and plavix  - Ensure appropriate f/u with cards  2.  Labs / imaging needed at time of follow-up: none  3.  Pending labs/ test needing follow-up: none  Follow-up Appointments: Follow-up Information    Cardiology, [facility] Follow up.   Specialty:  Cardiology          Hospital Course by problem  list: 1. Chest pain: [name] is a 73 yo female with a medical history of ESRD on HD TTS, SLE, paroxysmal atrial tachycardia,complete heart block s/p pacemaker, CVA with residuallight-sided weakness and aphasia,and dementia who presented with two episodes of mid chest pain with associated nausea and dyspnea after dialysis that resolved spontaneously. Initial I-stat troponin was elevated to 0.19. The patient was admitted for further evaluation and management. Troponins downtrended from 0.19 --> 0.07 --> 0.07. Elevated troponin is less significant in the setting of ESRD. EKG showed NSR with LAD and RBBB. No changes on repeat EKG. She was evaluated by cardiology. Patient did not want any invasive tests including cardiac cath. Continue medical management with home aspirin and plavix. Patient should follow with her cardiologist at [facility] in [place]. She was chest pain free when she was discharged back to her nursing facility.  2. ESRD on HD: Patient completed outpatient dialysis session the day of admission. She should continue her outpatient HD session with her normal facility starting tomorrow.  Discharge Vitals:   BP 94/61 (BP Location: Right Arm)   Pulse 85   Temp 99.5 F (37.5 C) (Oral)   Resp 18   Ht 5\' 5"  (1.651 m)   Wt 76.9 kg   SpO2 99%   BMI 28.21 kg/m   Pertinent Labs, Studies, and Procedures:  CMP Latest Ref Rng &  Units 06/21/2018  Glucose 70 - 99 mg/dL 95  BUN 8 - 23 mg/dL 15  Creatinine 0.44 - 1.00 mg/dL 5.30(H)  Sodium 135 - 145 mmol/L 135  Potassium 3.5 - 5.1 mmol/L 4.3  Chloride 98 - 111 mmol/L 101   CBC Latest Ref Rng & Units 06/21/2018  Hemoglobin 12.0 - 15.0 g/dL 15.0  Hematocrit 36.0 - 46.0 % 44.0    Discharge Instructions: Discharge Instructions    Discharge instructions   Complete by:  As directed    It was a pleasure taking care of you while you were in the hospital, Abigail Buck!  You were hospitalized for chest pain. Your blood work and your EKG were not  concerning for a heart attack and you do not wish to pursue aggressive interventions to further evaluate the pain. Therefore, you are appropriate for discharge back to your living facility.   We have not made any changes to your medications. You should resume your dialysis sessions as scheduled with your outpatient facility.   You should follow-up with your cardiologist at [facility] in [place] free to call our clinic at [phone] if you have any questions.  Thanks, Dr. Annie Paras   Increase activity slowly   Complete by:  As directed       Signed: Dorrell, Andree [place], MD 06/22/2018, 11:31 AM   Pager: [phone]

## 2018-06-22 NOTE — Progress Notes (Signed)
Report called to RN at [facility]. Margreta Journey, daughter, called and updated  [facility] picked patient up. Lift and shoes sent with patient.

## 2018-06-22 NOTE — Progress Notes (Signed)
   Subjective: No overnight events. Patient states she feels well today. Denies any more chest pain. She has not had breakfast yet but has an appetite.   Objective:  Vital signs in last 24 hours: Vitals:   06/21/18 1850 06/21/18 2115 06/22/18 0108 06/22/18 0415  BP: 109/71 (!) 90/55 105/65 94/61  Pulse: (!) 106 (!) 105 92 85  Resp: 18 18 18 18   Temp: 99.3 F (37.4 C) 99.7 F (37.6 C) 99.3 F (37.4 C) 99.5 F (37.5 C)  TempSrc: Oral Oral Oral Oral  SpO2: 100% 100% 94% 99%  Weight:   76.9 kg   Height:       Gen: comfortably laying in bed, NAD Lungs: CTAB, no wheezing MSK: no chest wall tenderness  Ext: no edema  Assessment/Plan:  Active Problems:   ESRD (end stage renal disease) on dialysis [facility])   Chest pain  Abigail Buck is a 73 yo female with a medical history of ESRD on HD TTS, SLE, paroxysmal atrial tachycardia, complete heart block s/p pacemaker, 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 Initial I-stat troponin was elevated to 0.19. The patient was admitted for further evaluation and management.  Chest pain - Troponins downtrended: 0.19 --> 0.07 --> 0.07 - Repeat EKG without changes from prior - Cardiology agrees with avoiding invasive tests per patient's wishes. - She is on DAPT with aspirin and plavix already for history of CVA Plan - Discharge back to nursing facility today - Outpatient f/u with her cardiologist  - Continue aspirin and plavix  ESRD on HD TTS - Last dialysis 1/14. Completed session. No recent missed sessions. Plan - Outpatient dialysis as regularly scheduled tomorrow  Blood pressure - Patient is on midodrine 10mg  daily at home on dialysis days for hypotension. She is also on Coreg 6.25mg  BID on non-dialysis days for HTN.  - BP this am is 94/61 Plan - Hold midodrine as it is a non-dialysis  day. - Hold Coreg today given soft BP.   Dispo: Anticipated discharge today  Dorrell, Andree Elk, MD 06/22/2018, 6:22 AM Pager: [phone]

## 2018-06-22 NOTE — Progress Notes (Signed)
Progress Note  Patient Name: Abigail Buck Date of Encounter: 06/22/2018  Primary Cardiologist: No primary care provider on file. Dr. Valentino Saxon  Subjective   No chest pain  Inpatient Medications    Scheduled Meds: . aspirin EC  81 mg Oral Daily  . cinacalcet  30 mg Oral Q supper  . clopidogrel  75 mg Oral Daily  . heparin  5,000 Units Subcutaneous Q8H  . hydroxychloroquine  200 mg Oral Daily  . sevelamer carbonate  800 mg Oral TID WC  . [START ON 06/27/2018] Vitamin D (Ergocalciferol)  50,000 Units Oral Weekly   Continuous Infusions:  PRN Meds: acetaminophen **OR** acetaminophen   Vital Signs    Vitals:   06/21/18 1850 06/21/18 2115 06/22/18 0108 06/22/18 0415  BP: 109/71 (!) 90/55 105/65 94/61  Pulse: (!) 106 (!) 105 92 85  Resp: 18 18 18 18   Temp: 99.3 F (37.4 C) 99.7 F (37.6 C) 99.3 F (37.4 C) 99.5 F (37.5 C)  TempSrc: Oral Oral Oral Oral  SpO2: 100% 100% 94% 99%  Weight:   76.9 kg   Height:        Intake/Output Summary (Last 24 hours) at 06/22/2018 0927 Last data filed at 06/21/2018 2230 Gross per 24 hour  Intake 240 ml  Output -  Net 240 ml   Last 3 Weights 06/22/2018 06/21/2018  Weight (lbs) 169 lb 8.5 oz 167 lb  Weight (kg) 76.9 kg 75.751 kg      Telemetry    sinus- Personally Reviewed  ECG    AV paced- Personally Reviewed  Physical Exam   GEN: No acute distress.   Neck: No JVD Cardiac: RRR, no murmurs, rubs, or gallops.  Respiratory: Clear to auscultation bilaterally. GI: Soft, nontender, non-distended  MS: No edema; No deformity. Neuro:  Nonfocal  Psych: Normal affect   Labs    Chemistry Recent Labs  Lab 06/21/18 1203  NA 135  K 4.3  CL 101  GLUCOSE 95  BUN 15  CREATININE 5.30*     Hematology Recent Labs  Lab 06/21/18 1203  HGB 15.0  HCT 44.0    Cardiac Enzymes Recent Labs  Lab 06/21/18 2045 06/22/18 0225  TROPONINI 0.07* 0.07*    Recent Labs  Lab 06/21/18 1201  TROPIPOC 0.19*     BNPNo  results for input(s): BNP, PROBNP in the last 168 hours.   DDimer No results for input(s): DDIMER in the last 168 hours.   Radiology    Dg Chest Port 1 View  Result Date: 06/21/2018 CLINICAL DATA:  Generalized chest pain that began while the patient was undergoing hemodialysis earlier this morning. EXAM: PORTABLE CHEST 1 VIEW COMPARISON:  None. FINDINGS: Cardiac silhouette mildly enlarged. Apparent diagonal branch of LAD coronary stent. Dense mitral annular calcification. Thoracic aorta atherosclerotic. Calcified LEFT hilar and LEFT-sided mediastinal lymph nodes. Calcified granuloma at the LEFT lung base. Lungs clear. Pulmonary vascularity normal. No visible pleural effusions. RIGHT subclavian dual lead transvenous pacemaker. LEFT jugular dialysis catheter tips project over the RIGHT atrium. IMPRESSION: Mild cardiomegaly. No acute cardiopulmonary disease. Old granulomatous disease. Electronically Signed   By: Evangeline Dakin M.D.   On: 06/21/2018 13:19    Cardiac Studies     Patient Profile     73 y.o. female with ESRD on HD, prior TIA, dementia, lupus, PAT and pacemaker admitted with chest pain.   Assessment & Plan    1. Chest pain/Elevated troponin: Pt admitted with one episode of chest pain. POC troponin mildly elevated  at 0.18. Serial troponin 0.07, 0.07). I think her troponin elevation is likely due to her ESRD and not to ACS. Given her immobile state and dementia, I would not consider her to be a good candidate for invasive cardiac evaluation. I would not recommend any changes at this time or further cardiac workup.    [facility] will sign off.   Medication Recommendations:  No changes Other recommendations (labs, testing, etc):  None Follow up as an outpatient:  With her cardiologist in [facility]  For questions or updates, please contact [place] Please consult [email or web address] for contact info under        Signed, Lauree Chandler, MD  06/22/2018, 9:27 AM

## 2018-06-22 NOTE — Clinical Social Work Note (Signed)
CSW facilitated patient discharge including contacting patient family (both daughters) and facility to confirm patient discharge plans. Clinical information faxed to facility and family agreeable with plan. CSW arranged ambulance transport via [facility] to [facility]. RN to call report prior to discharge ([phone]).  CSW will sign off for now as social work intervention is no longer needed. Please consult [place] again if new needs arise.  Dayton Scrape, [place]

## 2018-07-09 DIAGNOSIS — D649 Anemia, unspecified: Secondary | ICD-10-CM

## 2018-07-09 HISTORY — DX: Anemia, unspecified: D64.9

## 2018-07-22 ENCOUNTER — Encounter: Payer: Self-pay | Admitting: *Deleted

## 2018-07-22 ENCOUNTER — Other Ambulatory Visit: Payer: Self-pay | Admitting: *Deleted

## 2018-07-22 ENCOUNTER — Encounter: Payer: Self-pay | Admitting: Vascular Surgery

## 2018-07-22 ENCOUNTER — Other Ambulatory Visit: Payer: Self-pay

## 2018-07-22 ENCOUNTER — Ambulatory Visit (INDEPENDENT_AMBULATORY_CARE_PROVIDER_SITE_OTHER): Payer: Medicare Other | Admitting: Vascular Surgery

## 2018-07-22 VITALS — BP 84/59 | HR 84 | Temp 98.3°F | Resp 16 | Ht 69.0 in | Wt 175.0 lb

## 2018-07-22 DIAGNOSIS — I739 Peripheral vascular disease, unspecified: Secondary | ICD-10-CM | POA: Diagnosis not present

## 2018-07-22 NOTE — Progress Notes (Signed)
Patient ID: Abigail Buck, female   DOB: [birth year], 73 y.o.   MRN: [id number]  Reason for Consult: New Patient (Initial Visit) (occlusion of right popliteal artery)   Referred by No ref. provider found  Subjective:     HPI:  Abigail Buck is a 73 y.o. female with history of stroke and end-stage renal disease on dialysis Tuesday Thursdays and Saturday.Marland Kitchen  She was recently found to have ulceration of her right great toe with unknown trauma.  She does not walk she is chronically wheelchair-bound does use her legs to help transfer.  She does have issues with her right lower extremity having sudden movements.  She has not had any fevers.  Santyl is being placed on the wound.  She is never had wounds like this before does not have any contralateral wounds.  Risk factors include end-stage renal disease and family history of stroke and hypertension but she does not have these diagnoses herself.  Past Medical History:  Diagnosis Date  . Chronic kidney disease   . Lupus ([place])    Family History  Problem Relation Age of Onset  . Stroke Mother   . Hypertension Mother   . Heart murmur Father    Past Surgical History:  Procedure Laterality Date  . ABDOMINAL HYSTERECTOMY  1986    Short Social History:  Social History   Tobacco Use  . Smoking status: Never Smoker  . Smokeless tobacco: Never Used  Substance Use Topics  . Alcohol use: No    Allergies  Allergen Reactions  . Morphine Other (See Comments)    Opposite effect makes her agressive    Current Outpatient Medications  Medication Sig Dispense Refill  . aspirin EC 81 MG tablet Take 81 mg by mouth daily.    . carvedilol (COREG) 6.25 MG tablet Take 6.25 mg by mouth 2 (two) times daily with a meal.    . cinacalcet (SENSIPAR) 30 MG tablet Take 30 mg by mouth daily with supper.    . clopidogrel (PLAVIX) 75 MG tablet Take 75 mg by mouth daily.    . ergocalciferol (VITAMIN D2) 50000 units capsule Take 50,000 Units by mouth once a week.     . hydroxychloroquine (PLAQUENIL) 200 MG tablet Take by mouth daily.    . midodrine (PROAMATINE) 10 MG tablet Take 10 mg by mouth daily.    . sevelamer carbonate (RENVELA) 800 MG tablet Take 800 mg by mouth 3 (three) times daily with meals.     No current facility-administered medications for this visit.     Review of Systems  Constitutional:  Constitutional negative. Eyes: Eyes negative.  Respiratory: Respiratory negative.  Cardiovascular: Cardiovascular negative.  Skin: Positive for wound.  Neurological: Neurological negative. Hematologic: Hematologic/lymphatic negative.  Psychiatric: Positive for confusion.        Objective:  Objective   Vitals:   07/22/18 1324  BP: (!) 84/59  Pulse: 84  Resp: 16  Temp: 98.3 F (36.8 C)  TempSrc: Oral  SpO2: 97%  Weight: 175 lb (79.4 kg)  Height: 5\' 9"  (1.753 m)   Body mass index is 25.84 kg/m.  Physical Exam HENT:     Head: Normocephalic.  Eyes:     Pupils: Pupils are equal, round, and reactive to light.  Cardiovascular:     Rate and Rhythm: Normal rate.     Pulses:          Femoral pulses are 2+ on the right side and 2+ on the left side.  Popliteal pulses are 0 on the right side and 0 on the left side.     Comments: She actually has multiphasic signals at her right DP and posterior tibial arteries Abdominal:     General: Abdomen is flat.     Palpations: Abdomen is soft.  Neurological:     Mental Status: She is alert. Mental status is at baseline.     Coordination: Coordination abnormal.  Psychiatric:        Mood and Affect: Mood normal.        Behavior: Behavior normal.        Thought Content: Thought content normal.        Judgment: Judgment normal.     Data: -Outside duplex reading which demonstrates occluded popliteal artery on the right.  ABIs could not be performed.  She has triphasic waveforms on the right up to the level of the common femoral artery are monophasic below that.     Assessment/Plan:      73 year old female with end-stage renal disease on dialysis via catheter has right great toe ulceration.  We will get her set up for angiogram with right lower extremity runoff possible intervention in the near future.  Continue Santyl.  Continue aspirin Plavix.  I discussed that this is limb threatening ischemia and she could end up with amputation of her toe or with higher level amputation and the daughter whom is a respiratory therapist does demonstrate good understanding.    Abigail Sandy MD [facility]

## 2018-08-04 ENCOUNTER — Encounter: Payer: Self-pay | Admitting: Family Medicine

## 2018-08-08 ENCOUNTER — Inpatient Hospital Stay (HOSPITAL_COMMUNITY)
Admission: RE | Admit: 2018-08-08 | Discharge: 2018-08-10 | DRG: 255 | Disposition: A | Discharge status: Skilled Nursing Facility | Payer: Medicare Other | Attending: Vascular Surgery | Admitting: Vascular Surgery

## 2018-08-08 ENCOUNTER — Encounter (HOSPITAL_COMMUNITY)
Admission: RE | Disposition: A | Discharge status: Skilled Nursing Facility | Payer: Self-pay | Source: Home / Self Care | Attending: Vascular Surgery

## 2018-08-08 ENCOUNTER — Other Ambulatory Visit: Payer: Self-pay

## 2018-08-08 DIAGNOSIS — L97519 Non-pressure chronic ulcer of other part of right foot with unspecified severity: Secondary | ICD-10-CM | POA: Diagnosis present

## 2018-08-08 DIAGNOSIS — N186 End stage renal disease: Secondary | ICD-10-CM | POA: Diagnosis present

## 2018-08-08 DIAGNOSIS — I998 Other disorder of circulatory system: Secondary | ICD-10-CM | POA: Diagnosis present

## 2018-08-08 DIAGNOSIS — L7632 Postprocedural hematoma of skin and subcutaneous tissue following other procedure: Secondary | ICD-10-CM | POA: Diagnosis not present

## 2018-08-08 DIAGNOSIS — N2581 Secondary hyperparathyroidism of renal origin: Secondary | ICD-10-CM | POA: Diagnosis present

## 2018-08-08 DIAGNOSIS — I70235 Atherosclerosis of native arteries of right leg with ulceration of other part of foot: Secondary | ICD-10-CM

## 2018-08-08 DIAGNOSIS — Z90710 Acquired absence of both cervix and uterus: Secondary | ICD-10-CM

## 2018-08-08 DIAGNOSIS — M869 Osteomyelitis, unspecified: Secondary | ICD-10-CM | POA: Diagnosis present

## 2018-08-08 DIAGNOSIS — Z955 Presence of coronary angioplasty implant and graft: Secondary | ICD-10-CM

## 2018-08-08 DIAGNOSIS — F0390 Unspecified dementia without behavioral disturbance: Secondary | ICD-10-CM | POA: Diagnosis present

## 2018-08-08 DIAGNOSIS — Z992 Dependence on renal dialysis: Secondary | ICD-10-CM

## 2018-08-08 DIAGNOSIS — D631 Anemia in chronic kidney disease: Secondary | ICD-10-CM | POA: Diagnosis present

## 2018-08-08 DIAGNOSIS — Y848 Other medical procedures as the cause of abnormal reaction of the patient, or of later complication, without mention of misadventure at the time of the procedure: Secondary | ICD-10-CM | POA: Diagnosis not present

## 2018-08-08 DIAGNOSIS — M329 Systemic lupus erythematosus, unspecified: Secondary | ICD-10-CM | POA: Diagnosis present

## 2018-08-08 DIAGNOSIS — Z7982 Long term (current) use of aspirin: Secondary | ICD-10-CM

## 2018-08-08 DIAGNOSIS — Z79899 Other long term (current) drug therapy: Secondary | ICD-10-CM

## 2018-08-08 DIAGNOSIS — I2510 Atherosclerotic heart disease of native coronary artery without angina pectoris: Secondary | ICD-10-CM | POA: Diagnosis present

## 2018-08-08 DIAGNOSIS — Z7902 Long term (current) use of antithrombotics/antiplatelets: Secondary | ICD-10-CM

## 2018-08-08 DIAGNOSIS — I959 Hypotension, unspecified: Secondary | ICD-10-CM | POA: Diagnosis not present

## 2018-08-08 DIAGNOSIS — I120 Hypertensive chronic kidney disease with stage 5 chronic kidney disease or end stage renal disease: Secondary | ICD-10-CM | POA: Diagnosis present

## 2018-08-08 DIAGNOSIS — I69354 Hemiplegia and hemiparesis following cerebral infarction affecting left non-dominant side: Secondary | ICD-10-CM

## 2018-08-08 HISTORY — PX: ABDOMINAL AORTOGRAM W/LOWER EXTREMITY: CATH118223

## 2018-08-08 LAB — CBC
HCT: 25.1 % — ABNORMAL LOW (ref 36.0–46.0)
HEMATOCRIT: 29.8 % — AB (ref 36.0–46.0)
Hemoglobin: 9.2 g/dL — ABNORMAL LOW (ref 12.0–15.0)
Hemoglobin: 8.2 g/dL — ABNORMAL LOW (ref 12.0–15.0)
MCH: 29.1 pg (ref 26.0–34.0)
MCH: 30.1 pg (ref 26.0–34.0)
MCHC: 30.9 g/dL (ref 30.0–36.0)
MCHC: 32.7 g/dL (ref 30.0–36.0)
MCV: 94.3 fL (ref 80.0–100.0)
MCV: 92.3 fL (ref 80.0–100.0)
Platelets: 151 10*3/uL (ref 150–400)
Platelets: 128 10*3/uL — ABNORMAL LOW (ref 150–400)
RBC: 3.16 MIL/uL — ABNORMAL LOW (ref 3.87–5.11)
RBC: 2.72 MIL/uL — ABNORMAL LOW (ref 3.87–5.11)
RDW: 13.5 % (ref 11.5–15.5)
RDW: 13.8 % (ref 11.5–15.5)
WBC: 6.4 10*3/uL (ref 4.0–10.5)
WBC: 8 10*3/uL (ref 4.0–10.5)
nRBC: 0 % (ref 0.0–0.2)
nRBC: 0 % (ref 0.0–0.2)

## 2018-08-08 LAB — CREATININE, SERUM
Creatinine, Ser: 10.46 mg/dL — ABNORMAL HIGH (ref 0.44–1.00)
GFR calc Af Amer: 4 mL/min — ABNORMAL LOW (ref >60)
GFR calc non Af Amer: 3 mL/min — ABNORMAL LOW (ref >60)

## 2018-08-08 LAB — POTASSIUM: Potassium: 4.7 mmol/L (ref 3.5–5.1)

## 2018-08-08 LAB — SURGICAL PCR SCREEN
MRSA, PCR: NEGATIVE
STAPHYLOCOCCUS AUREUS: NEGATIVE

## 2018-08-08 LAB — ABO/RH: ABO/RH(D): O NEG

## 2018-08-08 SURGERY — ABDOMINAL AORTOGRAM W/LOWER EXTREMITY
Anesthesia: LOCAL | Laterality: Bilateral

## 2018-08-08 MED ORDER — ACETAMINOPHEN 325 MG PO TABS
650.0000 mg | ORAL_TABLET | ORAL | Status: DC | PRN
  Administered 2018-08-09 – 2018-08-10 (×2): 650 mg via ORAL
  Filled 2018-08-08 (×2): qty 2

## 2018-08-08 MED ORDER — OXYCODONE HCL 5 MG PO TABS
5.0000 mg | ORAL_TABLET | ORAL | Status: DC | PRN
  Administered 2018-08-08 – 2018-08-10 (×3): 5 mg via ORAL
  Administered 2018-08-10: 10 mg via ORAL
  Filled 2018-08-08 (×2): qty 1
  Filled 2018-08-08: qty 2
  Filled 2018-08-08: qty 1

## 2018-08-08 MED ORDER — SODIUM CHLORIDE 0.9% FLUSH
3.0000 mL | Freq: Two times a day (BID) | INTRAVENOUS | Status: DC
  Administered 2018-08-08 – 2018-08-10 (×2): 3 mL via INTRAVENOUS

## 2018-08-08 MED ORDER — CHLORHEXIDINE GLUCONATE CLOTH 2 % EX PADS
6.0000 | MEDICATED_PAD | Freq: Every day | CUTANEOUS | Status: DC
  Administered 2018-08-09 – 2018-08-10 (×2): 6 via TOPICAL

## 2018-08-08 MED ORDER — LIDOCAINE HCL (PF) 1 % IJ SOLN
INTRAMUSCULAR | Status: AC
  Filled 2018-08-08: qty 30

## 2018-08-08 MED ORDER — MAGNESIUM HYDROXIDE 400 MG/5ML PO SUSP: 15.0000 mL | Freq: Every day | ORAL | Status: DC | PRN

## 2018-08-08 MED ORDER — VITAMIN D (ERGOCALCIFEROL) 1.25 MG (50000 UNIT) PO CAPS: 50000.0000 [IU] | ORAL_CAPSULE | ORAL | Status: DC

## 2018-08-08 MED ORDER — SODIUM CHLORIDE 0.9% FLUSH: 3.0000 mL | INTRAVENOUS | Status: DC | PRN

## 2018-08-08 MED ORDER — CLOPIDOGREL BISULFATE 75 MG PO TABS
75.0000 mg | ORAL_TABLET | Freq: Every day | ORAL | Status: DC
  Administered 2018-08-10: 75 mg via ORAL
  Filled 2018-08-08: qty 1

## 2018-08-08 MED ORDER — LABETALOL HCL 5 MG/ML IV SOLN: 10.0000 mg | INTRAVENOUS | Status: DC | PRN

## 2018-08-08 MED ORDER — MUPIROCIN 2 % EX OINT
1.0000 "application " | TOPICAL_OINTMENT | Freq: Two times a day (BID) | CUTANEOUS | Status: DC
  Administered 2018-08-08 – 2018-08-10 (×3): 1 via NASAL
  Filled 2018-08-08 (×2): qty 22

## 2018-08-08 MED ORDER — ACETAMINOPHEN 325 MG PO TABS: 650.0000 mg | ORAL_TABLET | ORAL | Status: DC | PRN

## 2018-08-08 MED ORDER — OXYCODONE HCL 5 MG PO TABS: 5.0000 mg | ORAL_TABLET | ORAL | Status: DC | PRN

## 2018-08-08 MED ORDER — MIDODRINE HCL 5 MG PO TABS
10.0000 mg | ORAL_TABLET | Freq: Every day | ORAL | Status: DC
  Administered 2018-08-08 – 2018-08-10 (×3): 10 mg via ORAL
  Filled 2018-08-08 (×4): qty 2

## 2018-08-08 MED ORDER — SODIUM CHLORIDE 0.9 % IV SOLN
250.0000 mL | INTRAVENOUS | Status: DC | PRN
  Administered 2018-08-09: 15:00:00 via INTRAVENOUS

## 2018-08-08 MED ORDER — HYDRALAZINE HCL 20 MG/ML IJ SOLN: 5.0000 mg | INTRAMUSCULAR | Status: DC | PRN

## 2018-08-08 MED ORDER — SODIUM CHLORIDE 0.9% FLUSH: 3.0000 mL | Freq: Two times a day (BID) | INTRAVENOUS | Status: DC

## 2018-08-08 MED ORDER — CEFAZOLIN SODIUM-DEXTROSE 1-4 GM/50ML-% IV SOLN
1.0000 g | INTRAVENOUS | Status: AC
  Administered 2018-08-09: 1 g via INTRAVENOUS
  Filled 2018-08-08: qty 50

## 2018-08-08 MED ORDER — IODIXANOL 320 MG/ML IV SOLN
INTRAVENOUS | Status: DC | PRN
  Administered 2018-08-08: 135 mL via INTRA_ARTERIAL

## 2018-08-08 MED ORDER — ACETAMINOPHEN 325 MG PO TABS
650.0000 mg | ORAL_TABLET | ORAL | Status: DC | PRN
  Administered 2018-08-08: 650 mg via ORAL
  Filled 2018-08-08: qty 2

## 2018-08-08 MED ORDER — SODIUM CHLORIDE 0.9 % IV BOLUS
250.0000 mL | Freq: Once | INTRAVENOUS | Status: AC
  Administered 2018-08-08: 250 mL via INTRAVENOUS

## 2018-08-08 MED ORDER — ASPIRIN EC 81 MG PO TBEC
81.0000 mg | DELAYED_RELEASE_TABLET | Freq: Every day | ORAL | Status: DC
  Administered 2018-08-10: 81 mg via ORAL
  Filled 2018-08-08: qty 1

## 2018-08-08 MED ORDER — ONDANSETRON HCL 4 MG/2ML IJ SOLN: 4.0000 mg | Freq: Four times a day (QID) | INTRAMUSCULAR | Status: DC | PRN

## 2018-08-08 MED ORDER — HEPARIN SODIUM (PORCINE) 1000 UNIT/ML IJ SOLN
INTRAMUSCULAR | Status: AC
  Filled 2018-08-08: qty 1

## 2018-08-08 MED ORDER — LIDOCAINE HCL (PF) 1 % IJ SOLN
INTRAMUSCULAR | Status: DC | PRN
  Administered 2018-08-08: 15 mL via INTRADERMAL

## 2018-08-08 MED ORDER — SEVELAMER CARBONATE 800 MG PO TABS
800.0000 mg | ORAL_TABLET | Freq: Three times a day (TID) | ORAL | Status: DC
  Administered 2018-08-08: 800 mg via ORAL
  Filled 2018-08-08: qty 1

## 2018-08-08 MED ORDER — HYDROXYCHLOROQUINE SULFATE 200 MG PO TABS
200.0000 mg | ORAL_TABLET | Freq: Every day | ORAL | Status: DC
  Administered 2018-08-10: 200 mg via ORAL
  Filled 2018-08-08: qty 1

## 2018-08-08 MED ORDER — RENA-VITE PO TABS
1.0000 | ORAL_TABLET | Freq: Every day | ORAL | Status: DC
  Administered 2018-08-09: 1 via ORAL
  Filled 2018-08-08 (×2): qty 1

## 2018-08-08 MED ORDER — SODIUM CHLORIDE 0.9 % IV BOLUS
1000.0000 mL | Freq: Once | INTRAVENOUS | Status: AC
  Administered 2018-08-08: 1000 mL via INTRAVENOUS

## 2018-08-08 MED ORDER — HEPARIN SODIUM (PORCINE) 1000 UNIT/ML IJ SOLN
INTRAMUSCULAR | Status: DC | PRN
  Administered 2018-08-08: 10000 [IU] via INTRAVENOUS

## 2018-08-08 MED ORDER — CARVEDILOL 6.25 MG PO TABS
6.2500 mg | ORAL_TABLET | Freq: Two times a day (BID) | ORAL | Status: DC
  Administered 2018-08-08 – 2018-08-10 (×3): 6.25 mg via ORAL
  Filled 2018-08-08 (×3): qty 1

## 2018-08-08 MED ORDER — HEPARIN SODIUM (PORCINE) 5000 UNIT/ML IJ SOLN
5000.0000 [IU] | Freq: Three times a day (TID) | INTRAMUSCULAR | Status: DC
  Administered 2018-08-09 – 2018-08-10 (×2): 5000 [IU] via SUBCUTANEOUS
  Filled 2018-08-08 (×3): qty 1

## 2018-08-08 MED ORDER — ALPRAZOLAM 0.25 MG PO TABS
0.2500 mg | ORAL_TABLET | Freq: Two times a day (BID) | ORAL | Status: DC
  Administered 2018-08-08 – 2018-08-10 (×3): 0.25 mg via ORAL
  Filled 2018-08-08 (×3): qty 1

## 2018-08-08 MED ORDER — HEPARIN (PORCINE) IN NACL 1000-0.9 UT/500ML-% IV SOLN
INTRAVENOUS | Status: AC
  Filled 2018-08-08: qty 1000

## 2018-08-08 MED ORDER — HEPARIN (PORCINE) IN NACL 1000-0.9 UT/500ML-% IV SOLN
INTRAVENOUS | Status: DC | PRN
  Administered 2018-08-08 (×2): 500 mL

## 2018-08-08 MED ORDER — SODIUM CHLORIDE 0.9 % IV SOLN: 250.0000 mL | INTRAVENOUS | Status: DC | PRN

## 2018-08-08 MED ORDER — SEVELAMER CARBONATE 800 MG PO TABS
1600.0000 mg | ORAL_TABLET | Freq: Three times a day (TID) | ORAL | Status: DC
  Administered 2018-08-10 (×2): 1600 mg via ORAL
  Filled 2018-08-08 (×3): qty 2

## 2018-08-08 MED ORDER — MORPHINE SULFATE (PF) 10 MG/ML IV SOLN: 2.0000 mg | INTRAVENOUS | Status: DC | PRN

## 2018-08-08 MED ORDER — MORPHINE SULFATE (PF) 2 MG/ML IV SOLN: 2.0000 mg | INTRAVENOUS | Status: DC | PRN

## 2018-08-08 SURGICAL SUPPLY — 18 items
BAG SNAP BAND KOVER 36X36 (MISCELLANEOUS) ×2 IMPLANT
CATH OMNI FLUSH 5F 65CM (CATHETERS) ×4 IMPLANT
CATH QUICKCROSS SUPP .035X90CM (MICROCATHETER) ×2 IMPLANT
CLOSURE MYNX CONTROL 6F/7F (Vascular Products) ×2 IMPLANT
COVER DOME SNAP 22 D (MISCELLANEOUS) ×2 IMPLANT
GUIDEWIRE ANGLED .035X260CM (WIRE) ×2 IMPLANT
KIT MICROPUNCTURE NIT STIFF (SHEATH) ×2 IMPLANT
KIT PV (KITS) ×2 IMPLANT
SHEATH FLEX ANSEL ST 6FR 45CM (SHEATH) ×2 IMPLANT
SHEATH PINNACLE 5F 10CM (SHEATH) ×2 IMPLANT
SHEATH PINNACLE 6F 10CM (SHEATH) ×4 IMPLANT
SHEATH PROBE COVER 6X72 (BAG) ×2 IMPLANT
SHIELD RADPAD SCOOP 12X17 (MISCELLANEOUS) ×2 IMPLANT
SYR MEDRAD MARK V 150ML (SYRINGE) ×2 IMPLANT
TRANSDUCER W/STOPCOCK (MISCELLANEOUS) ×2 IMPLANT
TRAY PV CATH (CUSTOM PROCEDURE TRAY) ×2 IMPLANT
WIRE BENTSON .035X145CM (WIRE) ×2 IMPLANT
WIRE ROSEN-J .035X180CM (WIRE) ×2 IMPLANT

## 2018-08-08 NOTE — Progress Notes (Addendum)
2000: Pt's 2000 BP check SBP running in 60s, manual pressure 70/48. Pt slightly drowsy but arousable. Pt's hematoma unchanged from assessment at shift change, bruising and drainage on dressing marked. Pulses intact. Fields MD notified of pt status and verbal order for stat CBC and 250 NS bolus.  2200: PT's BP unchanged after bolus. Bruising on leg unchanged, hematoma actually smaller from initial assesment. Hbg 8.2. Fields updated on pt status and verbal order for 1L bolus given, running now. Will recheck CBC in AM. Will continue to closely monitor.  0130: BP up to 80/55, MAP 63; pt awake and conversing w/ daughter, assisted onto bedpan. L groind site unchanged.  0400: BP check again low, 78/42 MAP 55, assessment unchanged from earlier.  Pt is asymptomatic. Hgb down to 7.7. Fields updated on pt status, hold off on transfusion until AM, no new orders.

## 2018-08-08 NOTE — Op Note (Signed)
    Patient name: Abigail Buck MRN: [id number] DOB: 1946/01/10 Sex: female  08/08/2018 Pre-operative Diagnosis: Critical right lower extremity ischemia with toe ulceration Post-operative diagnosis:  Same Surgeon:  Erlene Quan C. Donzetta Matters, MD Procedure Performed: 1.  Ultrasound-guided cannulation left common femoral artery 2.  Aortogram 3.  Selection of right popliteal artery right lower extremity angiogram 4.  Minx device closure left common femoral artery  Indications: 73 year old female has history of end-stage renal disease has toe ulceration of the right great toe.  She has monophasic signals distal to the common femoral artery on the right is indicated for angiogram possible invention.  Findings: Aorta and iliac segments are free of flow-limiting stenosis.  Renal arteries are not identified she is on dialysis.  There are 2 areas of 50% stenosis of the right SFA that did not appear flow-limiting.  She has diffuse non-flow-limiting tibial disease in all 3 tibial arteries with flow all the way to her foot.  This is consistent with microvascular disease.  She will need consideration of right great toe amputation for pain control.   Procedure:  The patient was identified in the holding area and taken to room 8.  The patient was then placed supine on the table and prepped and draped in the usual sterile fashion.  A time out was called.  Ultrasound was used to evaluate the left common femoral artery which was noted to be patent.  This was cannulated micropuncture needle followed by wire and sheath.  Images saved the permanent record.  We placed a Bentson wire followed by 5 [place] sheath on the catheter level of L1.  Aortogram was performed.  We then crossed the bifurcation with Bentson wire perform right lower extremity angiogram.  Given thought that there were flow-limiting stenosis in the SFA we then placed a Rosen wire followed by a 6 [place] sheath patient was heparinized.  We performed further angiography  placed a Glidewire and quick cross catheter to the level of the popliteal performed tibial angiography.  There was no where to intervene patient most consistent with microvascular disease.  We removed all wires and catheters exchanged for short 6 French sheath deployed a minx device.  She tolerated procedure well without immediate complication.  Contrast: 135 cc  Lillieann Pavlich C. Donzetta Matters, MD [facility] [place] Office: [phone] Pager: [phone]

## 2018-08-08 NOTE — Progress Notes (Signed)
Dr Donzetta Matters ordered surg/tele, 5MW said with low bp they are declining, Dr Donzetta Matters was repaged, she can go to 4 E. New bed order placed.

## 2018-08-08 NOTE — Progress Notes (Signed)
Pt's daughter brought DNR form from home. Requests to be DNR. Spoke with Dr. Oneida Alar - received verbal order for DNR. Changed in computer.  Fritz Pickerel, RN

## 2018-08-08 NOTE — Significant Event (Signed)
Rapid Response Event Note  Overview:  Called to [facility] for outpatient with growing hematoma post angio Time Called: 1153 Arrival Time: 1155 Event Type: Hypotension, Other (Comment)  Initial Focused Assessment:  On arrival patient alert - slightly restless - warm and moist - pale - oriented - denies dizziness - complains only of right great toe pain and pain at left groin site where direct pressure is being held by RN's.  BP 63/28 right arm -  Daughter present who states patient BP normally around 90/systolic. HR 89 RR 24 O2 sats 97% on RA.  Left femoral puncture site DDI - pressure being held by RN - hematoma present - see RN's notes for measurements. Bruising noted - abd soft - denies back pain.   Bil BS present - clear lungs.    Interventions: NS 250cc bolus IV started - Dr. Donzetta Matters to bedside - orders for continued pressure for 20 more mintues and for fluids and labs.  IV team to bedside - #20 angio cath inserted right arm with siterite - labs drawn and sent.  BP again dropping after fluids decreased - second 250 cc NS bolus started - BP responding well.  Patient to be admitted per Dr. Claretha Cooper orders.  Patient remains alert - states she feels better - color better - asking for Sprite.  BP 90/43 with NS bolus infusing - RN to call MD for further IV fluid orders if needed after second bolus - lungs remain clear at this point.  Handoff to RN - to call as needed.    Plan of Care (if not transferred):  Event Summary: Name of Physician Notified: Dr. Donzetta Matters at ([facility])    at    Outcome: Other (Comment)(admitted post outpatient procedure)  Event End Time: 1250  Quin Hoop

## 2018-08-08 NOTE — Progress Notes (Signed)
Pt received from short stay. Oriented to room and equipment. Pt is A&O x4. Pt's daughter at bedside. L groin site level 2. Old drainage on gauze marked. Telemetry applied, CCMD notified x2. VSS. BP improved from short stay.   Abigail Buck

## 2018-08-08 NOTE — Progress Notes (Signed)
Per rapid response, if bp goes low again start bolus and page Dr Donzetta Matters, rapid bolus was started, dr paged, he feels she is ok with a low pressure reading, no further bolus, bp 74/39 mentation normal. Will continue to observe.

## 2018-08-08 NOTE — Discharge Instructions (Signed)

## 2018-08-08 NOTE — H&P (Signed)
   History and Physical Update  The patient was interviewed and re-examined.  The patient's previous History and Physical has been reviewed and is unchanged from recent office visit. Plan for angiogram to evaluate the right lower extremity for wounds.   Abigail Letarte C. Donzetta Matters, MD [facility] [place] Office: [phone] Pager: [phone]  08/08/2018, 8:17 AM

## 2018-08-08 NOTE — Progress Notes (Addendum)
Veronica rn hold ing pressure on arrival along with pam rn. Pressure held 10 minutes and hematoma soft, site was marked and it did continue to grow. Pressure held 10 more minutes. Will continue to monitor. Pt continues to move and raise her voice with family and staff, pt  Agitaed. Upset about possible toe amputation. Continue to monitor.

## 2018-08-08 NOTE — Consult Note (Addendum)
[facility] Renal Consultation Note    Indication for Consultation:  Management of ESRD/hemodialysis; anemia, hypertension/volume and secondary hyperparathyroidism  HPI: Abigail Buck is a 73 y.o. 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  PMHx also significant for PM 10/2017, CAD with stent 2012, hx prior atrial fibrillation had been on BB before PM placed, dementia and compliant with dialysis.  She resides at an ALF. She was seen by Dr. Donzetta Matters last month for evaluation of right great toe ulcer and admitted today following RLE angiogram which showed 2 areas of 50% stenosis of the right SFA that did not appear flow limiting.  She has diffuse nonflow limiting disease in all 3 tibial arterials c/w microvascular disease.  She will need consideration of right great to amputation for pain control and per daughter is agreable to this in the future..  Pre procedure istat K was 8.5 - on recheck 4.7.  hgb is 9.2.  Upon arival to short stay today - she had hypotensive episode with drop in BP and vomiting.  She has a left groin hematoma despite pressure being held.  She received several fluid bolus with improvement in BP to 90s.  She is admitted for overnight observation and dialysis here in the am due to hematoma, low BP and fragile state.   She is back to baseline at present.  Past Medical History:  Diagnosis Date  . Chronic kidney disease   . Lupus [facility])    Past Surgical History:  Procedure Laterality Date  . ABDOMINAL HYSTERECTOMY  1986  . PACEMAKER IMPLANT  10/08/2017   Done at [facility]   Family History  Problem Relation Age of Onset  . Stroke Mother   . Hypertension Mother   . Heart murmur Father    Social History:  reports that she has never smoked. She has never used smokeless tobacco. She reports that she does not drink alcohol or use drugs. Allergies  Allergen Reactions  . Morphine Other  (See Comments)    Opposite effect makes her agressive  . Clindamycin/Lincomycin     Listed on MAR   Prior to Admission medications   Medication Sig Start Date End Date Taking? Authorizing Provider  acetaminophen (TYLENOL) 325 MG tablet Take 650 mg by mouth every 4 (four) hours as needed (general discomfort).   Yes [provider]  ALPRAZolam (XANAX) 0.25 MG tablet Take 0.25 mg by mouth 2 (two) times daily.  10/14/17  Yes [provider]  aspirin EC 81 MG tablet Take 81 mg by mouth daily.   Yes [provider]  carvedilol (COREG) 6.25 MG tablet Take 6.25 mg by mouth 2 (two) times daily with a meal.   Yes [provider]  clopidogrel (PLAVIX) 75 MG tablet Take 75 mg by mouth daily.   Yes [provider]  ergocalciferol (VITAMIN D2) 50000 units capsule Take 50,000 Units by mouth every Friday.    Yes [provider]  hydroxychloroquine (PLAQUENIL) 200 MG tablet Take 200 mg by mouth daily at 12 noon.    Yes [provider]  magnesium hydroxide (MILK OF MAGNESIA) 400 MG/5ML suspension Take 15 mLs by mouth daily as needed (for 3 days - standing order for constipation).   Yes [provider]  midodrine (PROAMATINE) 10 MG tablet Take 10 mg by mouth daily at 12 noon.    Yes [provider]  sevelamer carbonate (RENVELA) 800  MG tablet Take 800 mg by mouth 3 (three) times daily with meals.   Yes [provider]   Current Facility-Administered Medications  Medication Dose Route Frequency Provider Last Rate Last Dose  . 0.9 %  sodium chloride infusion  250 mL Intravenous PRN [name], MD      . acetaminophen (TYLENOL) tablet 650 mg  650 mg Oral Q4H PRN [name], MD      . ALPRAZolam Duanne Moron) tablet 0.25 mg  0.25 mg Oral BID [name], MD      . aspirin EC tablet 81 mg  81 mg Oral Daily [name], MD      . carvedilol (COREG) tablet 6.25 mg  6.25 mg Oral  BID WC [name], MD      . clopidogrel (PLAVIX) tablet 75 mg  75 mg Oral Daily [name], MD      . heparin injection 5,000 Units  5,000 Units Subcutaneous Q8H [name], MD      . hydrALAZINE (APRESOLINE) injection 5 mg  5 mg Intravenous Q20 Min PRN [name], MD      . hydroxychloroquine (PLAQUENIL) tablet 200 mg  200 mg Oral Q1200 [name], MD      . labetalol (NORMODYNE,TRANDATE) injection 10 mg  10 mg Intravenous Q10 min PRN [name], MD      . magnesium hydroxide (MILK OF MAGNESIA) suspension 15 mL  15 mL Oral Daily PRN [name], MD      . midodrine (PROAMATINE) tablet 10 mg  10 mg Oral Q1200 [name], MD      . morphine 2 MG/ML injection 2 mg  2 mg Intravenous Q1H PRN [name], MD      . ondansetron [facility]) injection 4 mg  4 mg Intravenous Q6H PRN [name], MD      . oxyCODONE (Oxy IR/ROXICODONE) immediate release tablet 5-10 mg  5-10 mg Oral Q4H PRN [name], MD   5 mg at 08/08/18 1111  . sevelamer carbonate (RENVELA) tablet 800 mg  800 mg Oral TID WC [name], MD      . sodium chloride flush (NS) 0.9 % injection 3 mL  3 mL Intravenous Q12H [name], MD      . sodium chloride flush (NS) 0.9 % injection 3 mL  3 mL Intravenous PRN [name], MD      . Derrill Memo ON 08/12/2018] Vitamin D (Ergocalciferol) (DRISDOL) capsule 50,000 Units  50,000 Units Oral Q Estella Husk, MD       Labs: Basic Metabolic Panel: Recent Labs  Lab 08/08/18 0818 08/08/18 0832  NA 136  --   K >8.5* 4.7  GLUCOSE 95  --    Liver Function Tests: No results for input(s): AST, ALT, ALKPHOS, BILITOT, PROT, ALBUMIN in the last 168 hours. No results for input(s): LIPASE, AMYLASE in the last 168 hours. No results for input(s): AMMONIA in the last 168 hours. CBC: Recent Labs   Lab 08/08/18 0818 08/08/18 1223  WBC  --  6.4  HGB 11.9* 9.2*  HCT 35.0* 29.8*  MCV  --  94.3  PLT  --  151   ROS: As per HPI otherwise negative.  Physical Exam: Vitals:   08/08/18 1359 08/08/18 1419 08/08/18 1423 08/08/18 1444  BP: (!) 87/48   101/68  Pulse:  72 73 83  Resp: 15 12 20  (!) 21  Temp:    98.3 F (36.8 C)  TempSrc:    Oral  SpO2: 100% 100% 99% 99%  Weight:      Height:         General: WDWN NAD very HOH Head: NCAT sclera not icteric MMM Neck: Supple.  Lungs: CTA anteriorly and at baselines Breathing is unlabored. Heart: RRR with S1 S2. - paced rhythm on tele Abdomen: soft NT + BS Lower extremities: no LE edema - large left groin hematoma - right great toe ulcer dry Neuro: A & O  X 3. Moves all extremities spontaneously. Psych:  Responds to questions appropriately with a usual affect. Dialysis Access: right IJ [facility]   Home meds:  - aspirin 81/ clopidogrel 75mg   - carvedilol 6.25 bid/ midodrine 10 qd  - sevelamer carb 800 tid/ alprazolam 0.25 bid prn  - hydroxychloroquine 200 qd  - prn's/ vitamins  TTS [place]  right IJ TDC EDW 79   3h 24min  2 K 2.25 Ca  Heparin 4000 with 1000 mid tmt  - Hectorol 6 - parsabiv 5  - Mircera 30 q weeks - just resumed 2/27 - last was Dec -  hgb trending down - most recently 10.6 43% sat Ca corr 9s - P 5- 6s iPTH 339  Assessment/Plan: 1. right great toe ulcer with PAD diffuse disease - for future toe amputation for pain control 2. ESRD -  TTS - HD tomorrow - hold heparin, even though she has SQ heparin ordered  3. BP/volume  - gains modest - BP controlled - never high - on midodrine for BP support 4. Anemia with post procedure hematoma - hgb 9.2 before procedure - down some from last week - just resumed ESA 2/27 so not unusual - recent Fe ok- expect hgb to drop more- continue to trend - transfuse prn - given cardiac hx try to keep hgb > 8 5. Metabolic bone disease -  Continue VDRA and binders - Ca may trend up as we do  not have Parsabiv here to counteract her Ca levels- our records indicate she was on 4 renvela but hard to know what she was getting at SNF - plan follow P levels - if elevated need to increase renvela ac 6.  Nutrition - renal diet/vits  Myriam Jacobson, PA-C [facility] [phone] 08/08/2018, 3:09 PM   Pt seen, examined and agree w A/P as above.  [facility] 08/08/2018, 4:24 PM

## 2018-08-08 NOTE — Progress Notes (Signed)
1130 started holding pressure to left groin, hematoma larger from last markings, dr Donzetta Matters was paged// Aline Brochure rn arrived,, 1140 pt vomits, rapid response was called pressure held, currently on O2 [place], fluid bolus being given, Dr Donzetta Matters arrived and asked that her dialysis cath be accessed. Wants labs drawn, cbc type and screen. Iv team here to assist/ new iv started right upper bicep.  Pt responsive and appropriate. Pressure to left groin continues. Hematoma was 5"x7". Hemodialysis cath was soiled  fom emesis, Pam rn removed and replaced dressing. bolus of 300 ns given. Continue with iv ns bolus 250 ml for a total of 550 at completion. pressure 63/43, pt mentation is appropriate. . 1244 rapid response left, left groin no further change,

## 2018-08-09 ENCOUNTER — Observation Stay (HOSPITAL_COMMUNITY): Payer: Medicare Other | Admitting: Certified Registered Nurse Anesthetist

## 2018-08-09 ENCOUNTER — Encounter (HOSPITAL_COMMUNITY): Payer: Self-pay | Admitting: Vascular Surgery

## 2018-08-09 ENCOUNTER — Encounter (HOSPITAL_COMMUNITY)
Admission: RE | Disposition: A | Discharge status: Skilled Nursing Facility | Payer: Self-pay | Source: Home / Self Care | Attending: Vascular Surgery

## 2018-08-09 DIAGNOSIS — F0390 Unspecified dementia without behavioral disturbance: Secondary | ICD-10-CM | POA: Diagnosis present

## 2018-08-09 DIAGNOSIS — Z90710 Acquired absence of both cervix and uterus: Secondary | ICD-10-CM | POA: Diagnosis not present

## 2018-08-09 DIAGNOSIS — M86671 Other chronic osteomyelitis, right ankle and foot: Secondary | ICD-10-CM

## 2018-08-09 DIAGNOSIS — L7632 Postprocedural hematoma of skin and subcutaneous tissue following other procedure: Secondary | ICD-10-CM | POA: Diagnosis not present

## 2018-08-09 DIAGNOSIS — D631 Anemia in chronic kidney disease: Secondary | ICD-10-CM | POA: Diagnosis present

## 2018-08-09 DIAGNOSIS — M329 Systemic lupus erythematosus, unspecified: Secondary | ICD-10-CM | POA: Diagnosis present

## 2018-08-09 DIAGNOSIS — M79674 Pain in right toe(s): Secondary | ICD-10-CM | POA: Diagnosis present

## 2018-08-09 DIAGNOSIS — Z7902 Long term (current) use of antithrombotics/antiplatelets: Secondary | ICD-10-CM | POA: Diagnosis not present

## 2018-08-09 DIAGNOSIS — I69354 Hemiplegia and hemiparesis following cerebral infarction affecting left non-dominant side: Secondary | ICD-10-CM | POA: Diagnosis not present

## 2018-08-09 DIAGNOSIS — M869 Osteomyelitis, unspecified: Secondary | ICD-10-CM | POA: Diagnosis present

## 2018-08-09 DIAGNOSIS — N2581 Secondary hyperparathyroidism of renal origin: Secondary | ICD-10-CM | POA: Diagnosis present

## 2018-08-09 DIAGNOSIS — Z7982 Long term (current) use of aspirin: Secondary | ICD-10-CM | POA: Diagnosis not present

## 2018-08-09 DIAGNOSIS — Z79899 Other long term (current) drug therapy: Secondary | ICD-10-CM | POA: Diagnosis not present

## 2018-08-09 DIAGNOSIS — L97519 Non-pressure chronic ulcer of other part of right foot with unspecified severity: Secondary | ICD-10-CM | POA: Diagnosis present

## 2018-08-09 DIAGNOSIS — I2510 Atherosclerotic heart disease of native coronary artery without angina pectoris: Secondary | ICD-10-CM | POA: Diagnosis present

## 2018-08-09 DIAGNOSIS — I120 Hypertensive chronic kidney disease with stage 5 chronic kidney disease or end stage renal disease: Secondary | ICD-10-CM | POA: Diagnosis present

## 2018-08-09 DIAGNOSIS — I959 Hypotension, unspecified: Secondary | ICD-10-CM | POA: Diagnosis not present

## 2018-08-09 DIAGNOSIS — Y848 Other medical procedures as the cause of abnormal reaction of the patient, or of later complication, without mention of misadventure at the time of the procedure: Secondary | ICD-10-CM | POA: Diagnosis not present

## 2018-08-09 DIAGNOSIS — I70235 Atherosclerosis of native arteries of right leg with ulceration of other part of foot: Secondary | ICD-10-CM | POA: Diagnosis present

## 2018-08-09 DIAGNOSIS — N186 End stage renal disease: Secondary | ICD-10-CM | POA: Diagnosis present

## 2018-08-09 DIAGNOSIS — Z955 Presence of coronary angioplasty implant and graft: Secondary | ICD-10-CM | POA: Diagnosis not present

## 2018-08-09 DIAGNOSIS — Z992 Dependence on renal dialysis: Secondary | ICD-10-CM | POA: Diagnosis not present

## 2018-08-09 DIAGNOSIS — I998 Other disorder of circulatory system: Secondary | ICD-10-CM | POA: Diagnosis present

## 2018-08-09 HISTORY — PX: AMPUTATION: SHX166

## 2018-08-09 LAB — CBC
HCT: 23.5 % — ABNORMAL LOW (ref 36.0–46.0)
HCT: 21.3 % — ABNORMAL LOW (ref 36.0–46.0)
Hemoglobin: 7.7 g/dL — ABNORMAL LOW (ref 12.0–15.0)
Hemoglobin: 6.7 g/dL — CL (ref 12.0–15.0)
MCH: 30.3 pg (ref 26.0–34.0)
MCH: 29.9 pg (ref 26.0–34.0)
MCHC: 32.8 g/dL (ref 30.0–36.0)
MCHC: 31.5 g/dL (ref 30.0–36.0)
MCV: 92.5 fL (ref 80.0–100.0)
MCV: 95.1 fL (ref 80.0–100.0)
Platelets: 111 10*3/uL — ABNORMAL LOW (ref 150–400)
Platelets: 95 10*3/uL — ABNORMAL LOW (ref 150–400)
RBC: 2.54 MIL/uL — ABNORMAL LOW (ref 3.87–5.11)
RBC: 2.24 MIL/uL — ABNORMAL LOW (ref 3.87–5.11)
RDW: 13.9 % (ref 11.5–15.5)
RDW: 14.3 % (ref 11.5–15.5)
WBC: 8.5 10*3/uL (ref 4.0–10.5)
WBC: 9 10*3/uL (ref 4.0–10.5)
nRBC: 0 % (ref 0.0–0.2)
nRBC: 0 % (ref 0.0–0.2)

## 2018-08-09 LAB — BASIC METABOLIC PANEL
Anion gap: 12 (ref 5–15)
BUN: 52 mg/dL — AB (ref 8–23)
CO2: 22 mmol/L (ref 22–32)
Calcium: 8.3 mg/dL — ABNORMAL LOW (ref 8.9–10.3)
Chloride: 105 mmol/L (ref 98–111)
Creatinine, Ser: 11.03 mg/dL — ABNORMAL HIGH (ref 0.44–1.00)
GFR calc Af Amer: 4 mL/min — ABNORMAL LOW (ref >60)
GFR calc non Af Amer: 3 mL/min — ABNORMAL LOW (ref >60)
Glucose, Bld: 115 mg/dL — ABNORMAL HIGH (ref 70–99)
Potassium: 4.4 mmol/L (ref 3.5–5.1)
Sodium: 139 mmol/L (ref 135–145)

## 2018-08-09 LAB — GLUCOSE, CAPILLARY
GLUCOSE-CAPILLARY: 84 mg/dL (ref 70–99)
Glucose-Capillary: 104 mg/dL — ABNORMAL HIGH (ref 70–99)
Glucose-Capillary: 90 mg/dL (ref 70–99)
Glucose-Capillary: 94 mg/dL (ref 70–99)

## 2018-08-09 LAB — POCT I-STAT 4, (NA,K, GLUC, HGB,HCT)
Glucose, Bld: 95 mg/dL (ref 70–99)
Glucose, Bld: 81 mg/dL (ref 70–99)
HCT: 35 % — ABNORMAL LOW (ref 36.0–46.0)
HCT: 25 % — ABNORMAL LOW (ref 36.0–46.0)
Hemoglobin: 11.9 g/dL — ABNORMAL LOW (ref 12.0–15.0)
Hemoglobin: 8.5 g/dL — ABNORMAL LOW (ref 12.0–15.0)
Potassium: >8.5 mmol/L (ref 3.5–5.1)
Potassium: 3.3 mmol/L — ABNORMAL LOW (ref 3.5–5.1)
Sodium: 136 mmol/L (ref 135–145)
Sodium: 139 mmol/L (ref 135–145)

## 2018-08-09 LAB — PREPARE RBC (CROSSMATCH)

## 2018-08-09 LAB — PROTIME-INR
INR: 1.1 (ref 0.8–1.2)
Prothrombin Time: 13.6 seconds (ref 11.4–15.2)

## 2018-08-09 SURGERY — AMPUTATION DIGIT
Anesthesia: Monitor Anesthesia Care | Site: Toe | Laterality: Right

## 2018-08-09 MED ORDER — PHENYLEPHRINE 40 MCG/ML (10ML) SYRINGE FOR IV PUSH (FOR BLOOD PRESSURE SUPPORT)
PREFILLED_SYRINGE | INTRAVENOUS | Status: DC | PRN
  Administered 2018-08-09: 80 ug via INTRAVENOUS
  Administered 2018-08-09: 120 ug via INTRAVENOUS
  Administered 2018-08-09: 160 ug via INTRAVENOUS

## 2018-08-09 MED ORDER — VASOPRESSIN 20 UNIT/ML IV SOLN
INTRAVENOUS | Status: DC | PRN
  Administered 2018-08-09: 3 [IU] via INTRAVENOUS
  Administered 2018-08-09: 2 [IU] via INTRAVENOUS
  Administered 2018-08-09 (×2): 3 [IU] via INTRAVENOUS
  Administered 2018-08-09: 4 [IU] via INTRAVENOUS
  Administered 2018-08-09: 2 [IU] via INTRAVENOUS
  Administered 2018-08-09 (×2): 3 [IU] via INTRAVENOUS
  Administered 2018-08-09: 2 [IU] via INTRAVENOUS
  Administered 2018-08-09: 3 [IU] via INTRAVENOUS
  Administered 2018-08-09: 2 [IU] via INTRAVENOUS
  Administered 2018-08-09 (×2): 3 [IU] via INTRAVENOUS
  Administered 2018-08-09: 4 [IU] via INTRAVENOUS

## 2018-08-09 MED ORDER — FENTANYL CITRATE (PF) 100 MCG/2ML IJ SOLN
INTRAMUSCULAR | Status: DC | PRN
  Administered 2018-08-09 (×2): 25 ug via INTRAVENOUS

## 2018-08-09 MED ORDER — ONDANSETRON HCL 4 MG/2ML IJ SOLN: 4.0000 mg | Freq: Once | INTRAMUSCULAR | Status: DC | PRN

## 2018-08-09 MED ORDER — SODIUM CHLORIDE 0.9 % IV SOLN: 100.0000 mL | INTRAVENOUS | Status: DC | PRN

## 2018-08-09 MED ORDER — ALTEPLASE 2 MG IJ SOLR: 2.0000 mg | Freq: Once | INTRAMUSCULAR | Status: DC | PRN

## 2018-08-09 MED ORDER — PENTAFLUOROPROP-TETRAFLUOROETH EX AERO: 1.0000 "application " | INHALATION_SPRAY | CUTANEOUS | Status: DC | PRN

## 2018-08-09 MED ORDER — BACITRACIN ZINC 500 UNIT/GM EX OINT
TOPICAL_OINTMENT | CUTANEOUS | Status: AC
  Filled 2018-08-09: qty 28.35

## 2018-08-09 MED ORDER — PHENYLEPHRINE 40 MCG/ML (10ML) SYRINGE FOR IV PUSH (FOR BLOOD PRESSURE SUPPORT)
PREFILLED_SYRINGE | INTRAVENOUS | Status: AC
  Filled 2018-08-09: qty 10

## 2018-08-09 MED ORDER — HEPARIN SODIUM (PORCINE) 1000 UNIT/ML IJ SOLN
INTRAMUSCULAR | Status: AC
  Filled 2018-08-09: qty 4

## 2018-08-09 MED ORDER — LIDOCAINE HCL (PF) 1 % IJ SOLN: 5.0000 mL | INTRAMUSCULAR | Status: DC | PRN

## 2018-08-09 MED ORDER — ALBUMIN HUMAN 5 % IV SOLN
INTRAVENOUS | Status: DC | PRN
  Administered 2018-08-09: 14:00:00 via INTRAVENOUS

## 2018-08-09 MED ORDER — PROPOFOL 500 MG/50ML IV EMUL
INTRAVENOUS | Status: DC | PRN
  Administered 2018-08-09: 50 ug/kg/min via INTRAVENOUS

## 2018-08-09 MED ORDER — BACITRACIN ZINC 500 UNIT/GM EX OINT
TOPICAL_OINTMENT | CUTANEOUS | Status: DC | PRN
  Administered 2018-08-09: 1 via TOPICAL

## 2018-08-09 MED ORDER — HEPARIN SODIUM (PORCINE) 1000 UNIT/ML IJ SOLN
4.8000 mL | Freq: Once | INTRAMUSCULAR | Status: AC
  Administered 2018-08-09: 4800 [IU] via INTRAVENOUS

## 2018-08-09 MED ORDER — SODIUM CHLORIDE 0.9 % IV SOLN
INTRAVENOUS | Status: DC | PRN
  Administered 2018-08-09 (×2): via INTRAVENOUS

## 2018-08-09 MED ORDER — SODIUM CHLORIDE (PF) 0.9 % IJ SOLN
INTRAMUSCULAR | Status: AC
  Filled 2018-08-09: qty 20

## 2018-08-09 MED ORDER — LIDOCAINE-PRILOCAINE 2.5-2.5 % EX CREA
1.0000 "application " | TOPICAL_CREAM | CUTANEOUS | Status: DC | PRN
  Filled 2018-08-09: qty 5

## 2018-08-09 MED ORDER — HEPARIN SODIUM (PORCINE) 1000 UNIT/ML DIALYSIS
1000.0000 [IU] | INTRAMUSCULAR | Status: DC | PRN
  Filled 2018-08-09: qty 1

## 2018-08-09 MED ORDER — LIDOCAINE HCL (PF) 1 % IJ SOLN
INTRAMUSCULAR | Status: AC
  Filled 2018-08-09: qty 30

## 2018-08-09 MED ORDER — 0.9 % SODIUM CHLORIDE (POUR BTL) OPTIME
TOPICAL | Status: DC | PRN
  Administered 2018-08-09: 1000 mL

## 2018-08-09 MED ORDER — PROPOFOL 10 MG/ML IV BOLUS
INTRAVENOUS | Status: AC
  Filled 2018-08-09: qty 20

## 2018-08-09 MED ORDER — ONDANSETRON HCL 4 MG/2ML IJ SOLN
INTRAMUSCULAR | Status: DC | PRN
  Administered 2018-08-09: 4 mg via INTRAVENOUS

## 2018-08-09 MED ORDER — HEPARIN SODIUM (PORCINE) 1000 UNIT/ML IJ SOLN
2400.0000 [IU] | Freq: Once | INTRAMUSCULAR | Status: AC
  Administered 2018-08-09: 2400 [IU] via INTRAVENOUS
  Filled 2018-08-09: qty 2.4

## 2018-08-09 MED ORDER — VASOPRESSIN 20 UNIT/ML IV SOLN
INTRAVENOUS | Status: AC
  Filled 2018-08-09: qty 1

## 2018-08-09 MED ORDER — CEFAZOLIN SODIUM 1 G IJ SOLR
INTRAMUSCULAR | Status: AC
  Filled 2018-08-09: qty 10

## 2018-08-09 MED ORDER — SODIUM CHLORIDE 0.9% IV SOLUTION: Freq: Once | INTRAVENOUS | Status: DC

## 2018-08-09 MED ORDER — ONDANSETRON HCL 4 MG/2ML IJ SOLN
INTRAMUSCULAR | Status: AC
  Filled 2018-08-09: qty 2

## 2018-08-09 MED ORDER — SODIUM CHLORIDE 0.9 % IV SOLN
INTRAVENOUS | Status: DC | PRN
  Administered 2018-08-09: 50 ug/min via INTRAVENOUS

## 2018-08-09 MED ORDER — FENTANYL CITRATE (PF) 250 MCG/5ML IJ SOLN
INTRAMUSCULAR | Status: AC
  Filled 2018-08-09: qty 5

## 2018-08-09 MED ORDER — SODIUM CHLORIDE 0.9 % IV SOLN
INTRAVENOUS | Status: DC
  Administered 2018-08-09: 15:00:00 via INTRAVENOUS

## 2018-08-09 MED ORDER — SODIUM CHLORIDE (PF) 0.9 % IJ SOLN
INTRAMUSCULAR | Status: AC
  Filled 2018-08-09: qty 10

## 2018-08-09 MED ORDER — FENTANYL CITRATE (PF) 100 MCG/2ML IJ SOLN: 25.0000 ug | INTRAMUSCULAR | Status: DC | PRN

## 2018-08-09 SURGICAL SUPPLY — 27 items
BANDAGE ACE 4X5 VEL STRL LF (GAUZE/BANDAGES/DRESSINGS) ×3 IMPLANT
BANDAGE ELASTIC 4 VELCRO ST LF (GAUZE/BANDAGES/DRESSINGS) ×2 IMPLANT
BLADE AVERAGE 25MMX9MM (BLADE)
BLADE AVERAGE 25X9 (BLADE) IMPLANT
BNDG GAUZE ELAST 4 BULKY (GAUZE/BANDAGES/DRESSINGS) ×3 IMPLANT
CANISTER SUCT 3000ML PPV (MISCELLANEOUS) ×3 IMPLANT
COVER SURGICAL LIGHT HANDLE (MISCELLANEOUS) ×3 IMPLANT
COVER WAND RF STERILE (DRAPES) ×3 IMPLANT
DRAPE EXTREMITY T 121X128X90 (DISPOSABLE) ×3 IMPLANT
DRAPE HALF SHEET 40X57 (DRAPES) ×3 IMPLANT
ELECT REM PT RETURN 9FT ADLT (ELECTROSURGICAL) ×3
ELECTRODE REM PT RTRN 9FT ADLT (ELECTROSURGICAL) ×1 IMPLANT
GAUZE SPONGE 4X4 12PLY STRL (GAUZE/BANDAGES/DRESSINGS) ×3 IMPLANT
GLOVE BIO SURGEON STRL SZ7.5 (GLOVE) ×3 IMPLANT
GLOVE BIOGEL PI IND STRL 8 (GLOVE) ×1 IMPLANT
GLOVE BIOGEL PI INDICATOR 8 (GLOVE) ×2
GOWN STRL REUS W/ TWL LRG LVL3 (GOWN DISPOSABLE) ×3 IMPLANT
GOWN STRL REUS W/TWL LRG LVL3 (GOWN DISPOSABLE) ×4
KIT BASIN OR (CUSTOM PROCEDURE TRAY) ×3 IMPLANT
KIT TURNOVER KIT B (KITS) ×3 IMPLANT
NS IRRIG 1000ML POUR BTL (IV SOLUTION) ×3 IMPLANT
PACK GENERAL/GYN (CUSTOM PROCEDURE TRAY) ×3 IMPLANT
PAD ARMBOARD 7.5X6 YLW CONV (MISCELLANEOUS) ×6 IMPLANT
SUT ETHILON 3 0 PS 1 (SUTURE) ×3 IMPLANT
TOWEL GREEN STERILE (TOWEL DISPOSABLE) ×6 IMPLANT
UNDERPAD 30X30 (UNDERPADS AND DIAPERS) ×3 IMPLANT
WATER STERILE IRR 1000ML POUR (IV SOLUTION) ×1 IMPLANT

## 2018-08-09 NOTE — Anesthesia Preprocedure Evaluation (Addendum)
Anesthesia Evaluation  Patient identified by MRN, date of birth, ID band Patient awake    Reviewed: Allergy & Precautions, NPO status , Patient's Chart, lab work & pertinent test results  Airway Mallampati: II  TM Distance: >3 FB Neck ROM: Full    Dental  (+) Edentulous Upper, Edentulous Lower, Dental Advisory Given   Pulmonary neg pulmonary ROS,    Pulmonary exam normal breath sounds clear to auscultation       Cardiovascular Normal cardiovascular exam+ pacemaker  Rhythm:Regular Rate:Normal  ECHO: The left ventricle is normal in size. There is mild concentric left ventricular hypertrophy with normal wall motion and ejection fraction 65- 70%. Grade I mild diastolic dysfunction; abnormal relaxation pattern.   Neuro/Psych negative neurological ROS  negative psych ROS   GI/Hepatic negative GI ROS, Neg liver ROS,   Endo/Other  negative endocrine ROS  Renal/GU ESRF and DialysisRenal disease     Musculoskeletal negative musculoskeletal ROS (+)   Abdominal   Peds  Hematology  (+) anemia , Lupus    Anesthesia Other Findings Right toe nonviable tissue  Reproductive/Obstetrics                           Anesthesia Physical Anesthesia Plan  ASA: IV  Anesthesia Plan: MAC   Post-op Pain Management:    Induction: Intravenous  PONV Risk Score and Plan: 2 and Propofol infusion and Treatment may vary due to age or medical condition  Airway Management Planned: Nasal Cannula  Additional Equipment:   Intra-op Plan:   Post-operative Plan:   Informed Consent: I have reviewed the patients History and Physical, chart, labs and discussed the procedure including the risks, benefits and alternatives for the proposed anesthesia with the patient or authorized representative who has indicated his/her understanding and acceptance.     Dental advisory given  Plan Discussed with: CRNA  Anesthesia Plan  Comments:         Anesthesia Quick Evaluation

## 2018-08-09 NOTE — Anesthesia Procedure Notes (Signed)
Procedure Name: MAC Date/Time: 08/09/2018 1:30 PM Performed by: Harden Mo, CRNA Pre-anesthesia Checklist: Patient identified, Emergency Drugs available, Suction available and Patient being monitored Patient Re-evaluated:Patient Re-evaluated prior to induction Oxygen Delivery Method: Simple face mask Preoxygenation: Pre-oxygenation with 100% oxygen Induction Type: IV induction Placement Confirmation: positive ETCO2 and breath sounds checked- equal and bilateral Dental Injury: Teeth and Oropharynx as per pre-operative assessment

## 2018-08-09 NOTE — Progress Notes (Signed)
Pt adm to PACU on NEO drip at 74mcg/min---weaning as BP tolerates. Dr Roanna Banning at bedside.

## 2018-08-09 NOTE — Progress Notes (Signed)
Dr Roanna Banning here-NEO off-BP 140-180/ 120, SR w/BBB 90's, pt is awake, alert, oriented. Daughter here & fully updated. OK to tx pt back to 4E per MD

## 2018-08-09 NOTE — Op Note (Signed)
    NAME: Abigail Buck    MRN: [id number] DOB: May 01, 1946    DATE OF OPERATION: 08/09/2018  PREOP DIAGNOSIS:    Osteomyelitis right great toe  POSTOP DIAGNOSIS:    Same  PROCEDURE:    Right great toe amputation  SURGEON: Judeth Cornfield. Scot Dock, MD, FACS  ASSIST: None  ANESTHESIA: Digital block  EBL: Minimal  INDICATIONS:    Abigail Buck is a 73 y.o. female who had exposed bone at the end of her right great toe.  Right great toe amputation was recommended.  FINDINGS:   Good bleeding at the site of the amputation.  TECHNIQUE:   The patient was taken to the operating room and received minimal sedation.  The right foot was prepped and draped in usual sterile fashion.  A digital block was administered with 1% lidocaine without epinephrine.  A fishmouth incision was made encompassing the right great toe and the dissection carried down to the proximal phalanx which was divided with a bone cutter and then the edges were smoothed with a rongeur.  Hemostasis was obtained using electrocautery.  The incision was then closed with interrupted 3-0 nylon sutures.  A sterile dressing was applied.  The patient tolerated the procedure well and was transferred to the recovery room in stable condition.  All needle and sponge counts were correct.  Abigail Mayo, MD, FACS [facility]  DATE OF DICTATION:   08/09/2018

## 2018-08-09 NOTE — Progress Notes (Signed)
[facility] Progress Note  Subjective: on HD, bp's soft but no c/o per patient.    Vitals:   08/09/18 0830 08/09/18 0900 08/09/18 0930 08/09/18 1000  BP: (!) 82/45 (!) 84/79 (!) 88/54 (!) 78/55  Pulse: 84 85 86 89  Resp: (!) 21 20 19 20   Temp:      TempSrc:      SpO2:      Weight:      Height:        Inpatient medications: . heparin      . ALPRAZolam  0.25 mg Oral BID  . aspirin EC  81 mg Oral Daily  . carvedilol  6.25 mg Oral BID WC  . Chlorhexidine Gluconate Cloth  6 each Topical Q0600  . clopidogrel  75 mg Oral Daily  . heparin  4.8 mL Intravenous Once  . heparin  5,000 Units Subcutaneous Q8H  . hydroxychloroquine  200 mg Oral Q1200  . midodrine  10 mg Oral Q1200  . multivitamin  1 tablet Oral QHS  . mupirocin ointment  1 application Nasal BID  . sevelamer carbonate  1,600 mg Oral TID WC  . sodium chloride flush  3 mL Intravenous Q12H  . [START ON 08/12/2018] Vitamin D (Ergocalciferol)  50,000 Units Oral Q Fri   . sodium chloride    .  ceFAZolin (ANCEF) IV     sodium chloride, acetaminophen, hydrALAZINE, labetalol, morphine injection, ondansetron (ZOFRAN) IV, oxyCODONE, sodium chloride flush  Iron/TIBC/Ferritin/ %Sat No results found for: IRON, TIBC, FERRITIN, IRONPCTSAT  Exam: General: WDWN NAD very HOH Head: NCAT sclera not icteric MMM Neck: Supple.  Lungs: CTA anteriorly and at baselines Breathing is unlabored. Heart: RRR with S1 S2. - paced rhythm on tele Abdomen: soft NT + BS Lower extremities: diffuse splotchy skin discoloration x 4 ext (due to "lupus" per patient) , no LE edema - large left groin hematoma - right great toe ulcer Neuro: A & O  X 3. Moves all extremities spontaneously. Psych:  Responds to questions appropriately with a usual affect. Dialysis Access: right IJ [facility]   Home meds:  - aspirin 81/ clopidogrel 75mg   - carvedilol 6.25 bid/ midodrine 10 qd  - sevelamer carb 800 tid/ alprazolam 0.25 bid prn  - hydroxychloroquine 200  qd  - prn's/ vitamins  TTS [place] right IJ TDC EDW 79   3h 65min  2 K 2.25 Ca  Heparin 4000 with 1000 mid tmt  - Hectorol 6 - parsabiv 5  - Mircera 30 q weeks - just resumed 2/27 - last was Dec -  hgb trending down - most recently 10.6 43% sat Ca corr 9s - P 5- 6s iPTH 339  Assessment/Plan: 1. Right great toe ulcer with PAD diffuse disease - for future toe amputation for pain control 2. ESRD - TTS, HD today. Hold heparin, even though she has SQ heparin ordered  3. BP/volume  - gains modest, BP low, on long-term midodrine for BP support 4. Anemia ckd/ abl: with post procedure hematoma. Just resumed ESA 2/27, recent Fe ok, Hb down 7.7 today.  Continue to trend - transfuse prn - given cardiac hx try to keep hgb > 8 5. Metabolic bone disease -  Continue VDRA and binders - Ca may trend up as we do not have Parsabiv here to counteract her Ca levels- our records indicate she was on 4 renvela but hard to know what she was getting at SNF - plan follow P levels - if elevated  need to increase renvela ac 6.  Nutrition - renal diet/vits 7. H/o CVA w/ left hemiparesis   [facility] 08/09/2018, 10:41 AM  Recent Labs  Lab 08/08/18 0818 08/08/18 0832 08/08/18 1530 08/09/18 0315  NA 136  --   --  139  K >8.5* 4.7  --  4.4  CL  --   --   --  105  CO2  --   --   --  22  GLUCOSE 95  --   --  115*  BUN  --   --   --  52*  CREATININE  --   --  10.46* 11.03*  CALCIUM  --   --   --  8.3*  INR  --   --   --  1.1   No results for input(s): AST, ALT, ALKPHOS, BILITOT, PROT in the last 168 hours. Recent Labs  Lab 08/08/18 2100 08/09/18 0315  WBC 8.0 8.5  HGB 8.2* 7.7*  HCT 25.1* 23.5*  MCV 92.3 92.5  PLT 128* 111*

## 2018-08-09 NOTE — Progress Notes (Signed)
HD tx terminated as ordered. With 20 mins remaining. Early termination against medical advice signed. Pt in no acute distress. VSS. HD clinical manager aware.. Report given to primary nurse.

## 2018-08-09 NOTE — Progress Notes (Signed)
RN notified of critical lab value by lab of Hgb 6.7.  MD notified and NO received for 2x PRBC's.  Rapid response notified d/t hypotension and updated on bolus and blood.

## 2018-08-09 NOTE — Transfer of Care (Signed)
Immediate Anesthesia Transfer of Care Note  Patient: Abigail Buck  Procedure(s) Performed: AMPUTATION RIGHT GREAT TOE (Right Toe)  Patient Location: PACU  Anesthesia Type:MAC  Level of Consciousness: awake, alert  and oriented  Airway & Oxygen Therapy: Patient Spontanous Breathing and Patient connected to nasal cannula oxygen  Post-op Assessment: Report given to RN, Post -op Vital signs reviewed and stable and Patient moving all extremities X 4  Post vital signs: Reviewed and stable  Last Vitals:  Vitals Value Taken Time  BP 99/72 08/09/2018  3:05 PM  Temp    Pulse 73 08/09/2018  3:02 PM  Resp 25 08/09/2018  3:08 PM  SpO2 81 % 08/09/2018  3:02 PM  Vitals shown include unvalidated device data.  Last Pain:  Vitals:   08/09/18 1107  TempSrc: Oral  PainSc:          Complications: No apparent anesthesia complications

## 2018-08-09 NOTE — Anesthesia Postprocedure Evaluation (Signed)
Anesthesia Post Note  Patient: Abigail Buck  Procedure(s) Performed: AMPUTATION RIGHT GREAT TOE (Right Toe)     Patient location during evaluation: PACU Anesthesia Type: MAC Level of consciousness: awake and alert Pain management: pain level controlled Vital Signs Assessment: post-procedure vital signs reviewed and stable Respiratory status: spontaneous breathing, nonlabored ventilation, respiratory function stable and patient connected to nasal cannula oxygen Cardiovascular status: stable and blood pressure returned to baseline Postop Assessment: no apparent nausea or vomiting Anesthetic complications: no Comments: Patient accessed multiple times in PACU with family at bedside. Patient alert and oriented. Blood pressure obtained via cuff on left leg and right arm with questionable accuracy. Patient mentateing appropriately.Stable for transport.     Last Vitals:  Vitals:   08/09/18 1950 08/09/18 2015  BP: (!) 76/51 (!) 78/47  Pulse: (!) 107 (!) 105  Resp: (!) 28 (!) 28  Temp: 36.8 C 37.4 C  SpO2: (!) 89%     Last Pain:  Vitals:   08/09/18 2015  TempSrc: Oral  PainSc:                  [name] P [name]

## 2018-08-09 NOTE — Progress Notes (Signed)
Dr Roanna Banning here @ BS updating pt's daughter. NEO off.

## 2018-08-09 NOTE — Interval H&P Note (Signed)
History and Physical Interval Note:  08/09/2018 12:51 PM  Abigail Buck  has presented today for surgery, with the diagnosis of right toe nonviable tissue  The various methods of treatment have been discussed with the patient and family. After consideration of risks, benefits and other options for treatment, the patient has consented to  Procedure(s): AMPUTATION RIGHT GREAT TOE (Right) as a surgical intervention .  The patient's history has been reviewed, patient examined, no change in status, stable for surgery.  I have reviewed the patient's chart and labs.  Questions were answered to the patient's satisfaction.     [name]

## 2018-08-10 ENCOUNTER — Encounter (HOSPITAL_COMMUNITY): Payer: Self-pay | Admitting: Vascular Surgery

## 2018-08-10 LAB — TYPE AND SCREEN
ABO/RH(D): O NEG
Antibody Screen: NEGATIVE
Unit division: 0
Unit division: 0

## 2018-08-10 LAB — BPAM RBC
Blood Product Expiration Date: 202003052359
Blood Product Expiration Date: 202003062359
ISSUE DATE / TIME: 202003031949
ISSUE DATE / TIME: 202003032255
Unit Type and Rh: 9500
Unit Type and Rh: 9500

## 2018-08-10 LAB — RENAL FUNCTION PANEL
Albumin: 3.1 g/dL — ABNORMAL LOW (ref 3.5–5.0)
Anion gap: 13 (ref 5–15)
BUN: 18 mg/dL (ref 8–23)
CO2: 23 mmol/L (ref 22–32)
Calcium: 7.9 mg/dL — ABNORMAL LOW (ref 8.9–10.3)
Chloride: 103 mmol/L (ref 98–111)
Creatinine, Ser: 5.42 mg/dL — ABNORMAL HIGH (ref 0.44–1.00)
GFR calc Af Amer: 9 mL/min — ABNORMAL LOW (ref >60)
GFR calc non Af Amer: 7 mL/min — ABNORMAL LOW (ref >60)
Glucose, Bld: 103 mg/dL — ABNORMAL HIGH (ref 70–99)
Phosphorus: 4.3 mg/dL (ref 2.5–4.6)
Potassium: 3.8 mmol/L (ref 3.5–5.1)
Sodium: 139 mmol/L (ref 135–145)

## 2018-08-10 LAB — CBC
HCT: 31.2 % — ABNORMAL LOW (ref 36.0–46.0)
Hemoglobin: 9.9 g/dL — ABNORMAL LOW (ref 12.0–15.0)
MCH: 29.2 pg (ref 26.0–34.0)
MCHC: 31.7 g/dL (ref 30.0–36.0)
MCV: 92 fL (ref 80.0–100.0)
Platelets: 94 10*3/uL — ABNORMAL LOW (ref 150–400)
RBC: 3.39 MIL/uL — AB (ref 3.87–5.11)
RDW: 15.2 % (ref 11.5–15.5)
WBC: 8.8 10*3/uL (ref 4.0–10.5)
nRBC: 0 % (ref 0.0–0.2)

## 2018-08-10 LAB — GLUCOSE, CAPILLARY
GLUCOSE-CAPILLARY: 93 mg/dL (ref 70–99)
Glucose-Capillary: 77 mg/dL (ref 70–99)
Glucose-Capillary: 115 mg/dL — ABNORMAL HIGH (ref 70–99)

## 2018-08-10 MED ORDER — CHLORHEXIDINE GLUCONATE CLOTH 2 % EX PADS: 6.0000 | MEDICATED_PAD | Freq: Every day | CUTANEOUS | Status: DC

## 2018-08-10 MED ORDER — OXYCODONE HCL 5 MG PO TABS: 5.0000 mg | ORAL_TABLET | ORAL | 0 refills | Status: AC | PRN

## 2018-08-10 NOTE — Evaluation (Signed)
Physical Therapy Evaluation Patient Details Name: Abigail Buck MRN: [id number] DOB: 08/04/1945 Today's Date: 08/10/2018   History of Present Illness  73 y.o. 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  PMHx also significant for PM 10/2017, CAD with stent 2012, hx prior atrial fibrillation had been on BB before PM placed, dementia and compliant with dialysis.  She resides at an ALF. She was seen by Dr. Donzetta Matters last month for evaluation of right great toe ulcer, now s/p amputation.  Clinical Impression  Orders received for PT evaluation. Patient demonstrates significant deficits in functional mobility at baseline, patient is dependent and hoyer lift transfers at facility. At this time, do not feel therapy interventions for transfer education will be beneficial given baseline cognitive deficits and patients level of pain and anxiety. Recommend return to facility and continued use of hoyer lift for transfers. Will sign off at this time.      Follow Up Recommendations SNF    Equipment Recommendations  None recommended by PT    Recommendations for Other Services       Precautions / Restrictions Precautions Precautions: Fall Restrictions Weight Bearing Restrictions: Yes RLE Weight Bearing: Partial weight bearing RLE Partial Weight Bearing Percentage or Pounds: through heel only      Mobility  Bed Mobility Overal bed mobility: Needs Assistance Bed Mobility: Rolling;Supine to Sit;Sit to Supine Rolling: Mod assist   Supine to sit: Max assist Sit to supine: Max assist   General bed mobility comments: Max assist for most aspects of bed mobility to elevate trunk and rotate to EOB. Patient with some initiation of LE movement, limited RLE movement by pain and cognition.  Transfers                 General transfer comment: Patient hoyer lift dependent at baseline, unsafe to tolerate OOB transfer  despite attempts by   Ambulation/Gait             General Gait Details: non ambulatory at baseline  Stairs            Wheelchair Mobility    Modified Rankin (Stroke Patients Only) Modified Rankin (Stroke Patients Only) Pre-Morbid Rankin Score: Severe disability Modified Rankin: Severe disability     Balance Overall balance assessment: Needs assistance Sitting-balance support: Single extremity supported;Feet supported Sitting balance-Leahy Scale: Poor Sitting balance - Comments: reliance on physical assist, Min guard min assist at EOB                                     Pertinent Vitals/Pain      Home Living Family/patient expects to be discharged to:: Skilled nursing facility                      Prior Function Level of Independence: Needs assistance         Comments: pt is a SNF resident (has been since previous CVA), patient w/c bound at [facility] and required hoyer lift for transfers OOB at facility.     Hand Dominance        Extremity/Trunk Assessment   Upper Extremity Assessment Upper Extremity Assessment: LUE deficits/detail;Difficult to assess due to impaired cognition LUE Deficits / Details: Noted LUE contracture LUE Sensation: decreased light touch LUE Coordination: decreased fine motor;decreased gross motor    Lower Extremity Assessment Lower  Extremity Assessment: RLE deficits/detail;LLE deficits/detail;Difficult to assess due to impaired cognition RLE: Unable to fully assess due to pain LLE Deficits / Details: noted limitations in active movement, some flexion contracture  LLE Sensation: decreased light touch LLE Coordination: decreased fine motor;decreased gross motor       Communication      Cognition Arousal/Alertness: Awake/alert Behavior During Therapy: Anxious Overall Cognitive Status: History of cognitive impairments - at baseline                                        General  Comments      Exercises     Assessment/Plan    PT Assessment All further PT needs can be met in the next venue of care  PT Problem List Decreased strength;Decreased activity tolerance;Decreased balance;Decreased mobility;Decreased knowledge of use of DME;Decreased safety awareness;Cardiopulmonary status limiting activity;Pain;Obesity       PT Treatment Interventions      PT Goals (Current goals can be found in the Care Plan section)  Acute Rehab PT Goals PT Goal Formulation: All assessment and education complete, DC therapy    Frequency     Barriers to discharge        Co-evaluation               AM-PAC PT "6 Clicks" Mobility  Outcome Measure Help needed turning from your back to your side while in a flat bed without using bedrails?: A Lot Help needed moving from lying on your back to sitting on the side of a flat bed without using bedrails?: A Lot Help needed moving to and from a bed to a chair (including a wheelchair)?: Total Help needed standing up from a chair using your arms (e.g., wheelchair or bedside chair)?: Total Help needed to walk in hospital room?: Total Help needed climbing 3-5 steps with a railing? : Total 6 Click Score: 8    End of Session   Activity Tolerance: Patient limited by pain Patient left: in bed;with call bell/phone within reach;with bed alarm set Nurse Communication: Mobility status PT Visit Diagnosis: Pain Pain - Right/Left: Right Pain - part of body: Ankle and joints of foot    Time: 1062-6948 PT Time Calculation (min) (ACUTE ONLY): 21 min   Charges:   PT Evaluation $PT Eval Moderate Complexity: 1 Mod          Alben Deeds, PT DPT  Board Certified Neurologic Specialist Acute Rehabilitation Services Pager [phone] Office [phone]   Duncan Dull 08/10/2018, 11:29 AM

## 2018-08-10 NOTE — Clinical Social Work Note (Signed)
Clinical Social Work Assessment  Patient Details  Name: Abigail Buck MRN: [id number] Date of Birth: 12/06/1945  Date of referral:  08/08/18               Reason for consult:  Facility Placement                Permission sought to share information with:  Family Supports Permission granted to share information::  Yes, Verbal Permission Granted  Name::     Yvonna Alanis   Agency::  [facility]   Relationship::  Psychologist, forensic Information:  [phone]  Housing/Transportation Living arrangements for the past 2 months:  [place] of Information:  Adult Children Patient Interpreter Needed:  None Criminal Activity/Legal Involvement Pertinent to Current Situation/Hospitalization:  No - Comment as needed Significant Relationships:  Adult Children Lives with:  Facility Resident Do you feel safe going back to the place where you live?  Yes Need for family participation in patient care:  Yes (Comment)  Care giving concerns:  Patient is from [facility]. Patient and family anticipates returning to SNF once discharged.   Social Worker assessment / plan:  CSW spoke with patient's family  Regarding returning to [facility]. Patient and family agree with the discharge plan.   Employment status:  Disabled (Comment on whether or not currently receiving Disability) Insurance information:  Medicare PT Recommendations:  [place] / Referral to community resources:  [place]  Patient/Family's Response to care:  Patient's daughter is very involved with the patient's care. She expressed appreciation for CSW assistance.  Patient/Family's Understanding of and Emotional Response to Diagnosis, Current Treatment, and Prognosis:  Patient's family  expressed understanding of CSW role and discharge process as well as medical condition. No questions/concerns about plan or treatment at this time.   Emotional  Assessment Appearance:  Appears stated age Attitude/Demeanor/Rapport:  Engaged, Self-Confident Affect (typically observed):  Appropriate, Accepting, Frustrated Orientation:  Oriented to Self, Oriented to Place, Oriented to  Time, Oriented to Situation Alcohol / Substance use:  Not Applicable Psych involvement (Current and /or in the community):  No (Comment)  Discharge Needs  Concerns to be addressed:  No discharge needs identified Readmission within the last 30 days:  No Current discharge risk:  None Barriers to Discharge:  Continued Medical Work up   [employer], [place] 08/10/2018, 4:58 PM

## 2018-08-10 NOTE — Progress Notes (Signed)
Left arm is restricted for BP.

## 2018-08-10 NOTE — Progress Notes (Signed)
Patient will DC [id number]  DC Date: 08/10/2018 Family Notified: Margreta Journey, daughter  Transport By: Corey Harold   RN, patient, and facility notified of DC. Discharge Summary sent to facility. RN given number for report ([place]) [phone] or [phone].  Ambulance transport requested for patient.   Clinical Social Worker signing off. Thurmond Butts, MSW, [place] Social Worker [phone]

## 2018-08-10 NOTE — Progress Notes (Signed)
Orthopedic Tech Progress Note Patient Details:  Abigail Buck [birth year] [id number]  Ortho Devices Type of Ortho Device: Postop shoe/boot Ortho Device/Splint Location: LRE Ortho Device/Splint Interventions: Adjustment, Application, Ordered   Post Interventions Patient Tolerated: Well Instructions Provided: Care of device, Adjustment of device   Janit Pagan 08/10/2018, 2:42 PM

## 2018-08-10 NOTE — NC FL2 (Signed)
[place] MEDICAID FL2 LEVEL OF CARE SCREENING TOOL     IDENTIFICATION  Patient Name: Abigail Buck Birthdate: May 29, 1946 Sex: female Admission Date (Current Location): 08/08/2018  [facility] and [place] Number:  Herbalist and Address:  [facility] [place], [place] [address] [place], [place] [address]      Provider Number: [id number]  Attending Physician Name and Address:  [place], [place] Name and Phone Number:  Serafina Royals, daughter, [phone]    Current Level of Care: Hospital Recommended Level of Care: [place] Prior Approval Number:    Date Approved/Denied:   PASRR Number: [id number]  Discharge Plan: SNF    Current Diagnoses: Patient Active Problem List   Diagnosis Date Noted  . ESRD (end stage renal disease) ([place]) 08/08/2018  . History of cerebral infarction   . Chest pain 06/21/2018  . Lupus ([place])   . Elevated troponin   . Artificial pacemaker   . Right bundle branch block   . ESRD (end stage renal disease) on dialysis ([place]) 03/12/2017    Orientation RESPIRATION BLADDER Height & Weight     Self, Time, Situation, Place  Normal Continent Weight: 178 lb 2.1 oz (80.8 kg) Height:  5\' 8"  (172.7 cm)  BEHAVIORAL SYMPTOMS/MOOD NEUROLOGICAL BOWEL NUTRITION STATUS      Continent Diet  AMBULATORY STATUS COMMUNICATION OF NEEDS Skin   Extensive Assist   Surgical wounds, Other (Comment)(incision on leg with gauze and compression wrap to be changed daily; wound on left toes)                       Personal Care Assistance Level of Assistance  Bathing, Feeding, Dressing Bathing Assistance: Maximum assistance Feeding assistance: Independent Dressing Assistance: Maximum assistance     Functional Limitations Info  Sight, Hearing, Speech Sight Info: Adequate Hearing Info: Adequate Speech Info: Adequate    SPECIAL CARE FACTORS FREQUENCY  PT (By licensed PT), OT (By licensed OT)     PT  Frequency: 5x week OT Frequency: 5x week            Contractures Contractures Info: Not present    Additional Factors Info  Code Status, Allergies, Psychotropic Code Status Info: Full Code Allergies Info: MORPHINE, CLINDAMYCIN/LINCOMYCIN  Psychotropic Info: ALPRAZolam (XANAX) tablet 0.25 mg 2x daily PO         Current Medications (08/10/2018):  This is the current hospital active medication list Current Facility-Administered Medications  Medication Dose Route Frequency Provider Last Rate Last Dose  . 0.9 %  sodium chloride infusion (Manually program via Guardrails IV Fluids)   Intravenous Once Angelia Mould, MD      . 0.9 %  sodium chloride infusion  250 mL Intravenous PRN Angelia Mould, MD      . 0.9 %  sodium chloride infusion  100 mL Intravenous PRN Alric Seton, PA-C      . 0.9 %  sodium chloride infusion  100 mL Intravenous PRN Alric Seton, PA-C      . 0.9 %  sodium chloride infusion   Intravenous Continuous Angelia Mould, MD 10 mL/hr at 08/09/18 1525    . acetaminophen (TYLENOL) tablet 650 mg  650 mg Oral Q4H PRN Angelia Mould, MD   650 mg at 08/10/18 1037  . ALPRAZolam Duanne Moron) tablet 0.25 mg  0.25 mg Oral BID Angelia Mould, MD   0.25 mg at 08/10/18 1037  . alteplase (CATHFLO ACTIVASE) injection 2 mg  2 mg Intracatheter Once PRN Alric Seton, PA-C      . aspirin EC tablet 81 mg  81 mg Oral Daily Angelia Mould, MD   81 mg at 08/10/18 1037  . carvedilol (COREG) tablet 6.25 mg  6.25 mg Oral BID WC Angelia Mould, MD   6.25 mg at 08/10/18 0751  . Chlorhexidine Gluconate Cloth 2 % PADS 6 each  6 each Topical Q0600 Angelia Mould, MD   6 each at 08/10/18 [phone]  . Chlorhexidine Gluconate Cloth 2 % PADS 6 each  6 each Topical Q0600 Roney Jaffe, MD      . clopidogrel (PLAVIX) tablet 75 mg  75 mg Oral Daily Angelia Mould, MD   75 mg at 08/10/18 1037  . heparin injection 1,000 Units  1,000 Units  Dialysis PRN Alric Seton, PA-C      . heparin injection 5,000 Units  5,000 Units Subcutaneous Q8H Angelia Mould, MD   5,000 Units at 08/10/18 [phone]  . hydrALAZINE (APRESOLINE) injection 5 mg  5 mg Intravenous Q20 Min PRN Angelia Mould, MD      . hydroxychloroquine (PLAQUENIL) tablet 200 mg  200 mg Oral Q1200 Angelia Mould, MD   200 mg at 08/10/18 1037  . labetalol (NORMODYNE,TRANDATE) injection 10 mg  10 mg Intravenous Q10 min PRN Angelia Mould, MD      . lidocaine (PF) (XYLOCAINE) 1 % injection 5 mL  5 mL Intradermal PRN Alric Seton, PA-C      . lidocaine-prilocaine (EMLA) cream 1 application  1 application Topical PRN Alric Seton, PA-C      . midodrine (PROAMATINE) tablet 10 mg  10 mg Oral Q1200 Angelia Mould, MD   10 mg at 08/10/18 1358  . morphine 2 MG/ML injection 2 mg  2 mg Intravenous Q1H PRN Angelia Mould, MD      . multivitamin (RENA-VIT) tablet 1 tablet  1 tablet Oral QHS Angelia Mould, MD   1 tablet at 08/09/18 2141  . mupirocin ointment (BACTROBAN) 2 % 1 application  1 application Nasal BID Angelia Mould, MD   1 application at 27/03/50 1040  . ondansetron (ZOFRAN) injection 4 mg  4 mg Intravenous Q6H PRN Angelia Mould, MD      . oxyCODONE (Oxy IR/ROXICODONE) immediate release tablet 5-10 mg  5-10 mg Oral Q4H PRN Angelia Mould, MD   5 mg at 08/10/18 0746  . pentafluoroprop-tetrafluoroeth (GEBAUERS) aerosol 1 application  1 application Topical PRN Alric Seton, PA-C      . sevelamer carbonate (RENVELA) tablet 1,600 mg  1,600 mg Oral TID WC Angelia Mould, MD   1,600 mg at 08/10/18 1358  . sodium chloride flush (NS) 0.9 % injection 3 mL  3 mL Intravenous Q12H Angelia Mould, MD   3 mL at 08/10/18 1041  . sodium chloride flush (NS) 0.9 % injection 3 mL  3 mL Intravenous PRN Angelia Mould, MD      . Derrill Memo ON 08/12/2018] Vitamin D (Ergocalciferol) (DRISDOL) capsule  50,000 Units  50,000 Units Oral Q Nonie Hoyer, MD         Discharge Medications: Please see discharge summary for a list of discharge medications.  Relevant Imaging Results:  Relevant Lab Results:   Additional Information SS#: [id number]. HD TTS.  Alexander Mt, LCSWA

## 2018-08-10 NOTE — Discharge Summary (Signed)
Physician Discharge Summary   Patient ID: Abigail Buck [id number] 73 y.o. [birth year]  Admit date: 08/08/2018  Discharge date and time: 08/10/18   Admitting Physician: [name], MD   Discharge Physician: same  Admission Diagnoses: PVD ulcer  Discharge Diagnoses: same  Admission Condition: fair  Discharged Condition: fair  Indication for Admission: Critical limb ischemia right lower extremity  Hospital Course: Mrs. Abigail Buck is a 73 year old female who was brought in as an outpatient for aortogram with right lower extremity runoff by Dr. Donzetta Matters on 08/08/2018 due to critical limb ischemia with tissue loss.  Based on angiography there were several areas of stenosis in multiple segments however no areas were flow-limiting.  She was then brought to the operating room by Dr. Scot Dock on 08/09/2018 for right great toe amputation.  She tolerated this procedure well and was kept for pain control postoperatively.  POD #1 hemoglobin was noted to be 6.7.  She received 2 units of packed red blood cells.  The following day hemoglobin was stable and drop in hemoglobin etiology was unknown.  Toe amputation site unremarkable today.  She is ready for discharge back to her skilled nursing facility.  It should also be mentioned that nephrology was consulted during inpatient stay for management of end-stage renal disease on hemodialysis.  She will follow-up with Dr. Donzetta Matters for wound check in about couple weeks.  She has been instructed to protect her toe amp site however this can be open to air when at rest.  Discharge instructions were reviewed with the patient and she voiced her understanding.  She will be discharged back to a skilled nursing facility in stable condition.  Consults: nephrology  Treatments: surgery: Aortogram with right lower extremity runoff by Dr. Donzetta Matters on 08/08/2018 Right great toe amputation by Dr. Scot Dock on 08/09/2018   Discharge Exam: See progress note 08/10/2018 Vitals:    08/10/18 0400 08/10/18 1104  BP: 101/63 106/63  Pulse: 93 87  Resp: (!) 22 (!) 23  Temp: 97.8 F (36.6 C) 99.1 F (37.3 C)  SpO2: 91% 92%     Disposition: Discharge disposition: 03-Skilled Nursing Facility       Patient Instructions:  Allergies as of 08/10/2018      Reactions   Morphine Other (See Comments)   Opposite effect makes her agressive   Clindamycin/lincomycin    Listed on MAR      Medication List    TAKE these medications   acetaminophen 325 MG tablet Commonly known as:  TYLENOL Take 650 mg by mouth every 4 (four) hours as needed (general discomfort).   ALPRAZolam 0.25 MG tablet Commonly known as:  XANAX Take 0.25 mg by mouth 2 (two) times daily.   aspirin EC 81 MG tablet Take 81 mg by mouth daily.   carvedilol 6.25 MG tablet Commonly known as:  COREG Take 6.25 mg by mouth 2 (two) times daily with a meal.   clopidogrel 75 MG tablet Commonly known as:  PLAVIX Take 75 mg by mouth daily.   ergocalciferol 1.25 MG (50000 UT) capsule Commonly known as:  VITAMIN D2 Take 50,000 Units by mouth every Friday.   hydroxychloroquine 200 MG tablet Commonly known as:  PLAQUENIL Take 200 mg by mouth daily at 12 noon.   magnesium hydroxide 400 MG/5ML suspension Commonly known as:  MILK OF MAGNESIA Take 15 mLs by mouth daily as needed (for 3 days - standing order for constipation).   midodrine 10 MG tablet Commonly known as:  PROAMATINE Take 10 mg  by mouth daily at 12 noon.   oxyCODONE 5 MG immediate release tablet Commonly known as:  Oxy IR/ROXICODONE Take 1-2 tablets (5-10 mg total) by mouth every 4 (four) hours as needed for moderate pain.   sevelamer carbonate 800 MG tablet Commonly known as:  RENVELA Take 800 mg by mouth 3 (three) times daily with meals.      Activity: activity as tolerated Diet: regular diet Wound Care: keep wound clean and dry and reinforce dressing PRN  Follow-up with Dr. Donzetta Matters in 2 weeks.  SignedDagoberto Ligas 08/10/2018 1:42 PM

## 2018-08-10 NOTE — Progress Notes (Signed)
   VASCULAR SURGERY ASSESSMENT & PLAN:   I was notified that the patient's blood pressure has been in the 60s.  She is mentating at her baseline and so I find the blood pressure somewhat hard to believe.  Reportedly they have tried blood pressures in both arms and there is no significant difference.  On exam she does not have any change in the ecchymosis in her left groin and her abdomen is nontender.  I think the yield of a CT scan would be very low but this is something to consider if her hemoglobin does not improve appropriately after her 2 units of packed red blood cells.  She had minimal sedation today for her toe amputation and this was mostly done under a digital block.   SUBJECTIVE:   No specific complaints.  PHYSICAL EXAM:   Vitals:   08/09/18 2200 08/09/18 2246 08/09/18 2302 08/09/18 2316  BP:  (!) 73/53 (!) 79/48 (!) 81/53  Pulse: (!) 112 (!) 113 (!) 111 (!) 113  Resp: (!) 29 (!) 28 (!) 28 (!) 26  Temp:  100.2 F (37.9 C) 100 F (37.8 C) (!) 100.9 F (38.3 C)  TempSrc:  Axillary Axillary Oral  SpO2: 92%  91%   Weight:      Height:       Ecchymosis left groin is unchanged. Her abdomen is nontender.  LABS:   Lab Results  Component Value Date   WBC 9.0 08/09/2018   HGB 6.7 (LL) 08/09/2018   HCT 21.3 (L) 08/09/2018   MCV 95.1 08/09/2018   PLT 95 (L) 08/09/2018   Lab Results  Component Value Date   CREATININE 5.42 (H) 08/09/2018   Lab Results  Component Value Date   INR 1.1 08/09/2018   CBG (last 3)  Recent Labs    08/09/18 1104 08/09/18 1652 08/09/18 2140  GLUCAP 90 84 94    PROBLEM LIST:    Active Problems:   ESRD (end stage renal disease) ([place])   CURRENT MEDS:   . sodium chloride   Intravenous Once  . ALPRAZolam  0.25 mg Oral BID  . aspirin EC  81 mg Oral Daily  . carvedilol  6.25 mg Oral BID WC  . Chlorhexidine Gluconate Cloth  6 each Topical Q0600  . clopidogrel  75 mg Oral Daily  . heparin  5,000 Units Subcutaneous Q8H  .  hydroxychloroquine  200 mg Oral Q1200  . midodrine  10 mg Oral Q1200  . multivitamin  1 tablet Oral QHS  . mupirocin ointment  1 application Nasal BID  . sevelamer carbonate  1,600 mg Oral TID WC  . sodium chloride flush  3 mL Intravenous Q12H  . [START ON 08/12/2018] Vitamin D (Ergocalciferol)  50,000 Units Oral Q Darylene Price Beeper: [phone] Office: [phone] 08/10/2018

## 2018-08-10 NOTE — Progress Notes (Signed)
[facility] Progress Note  Subjective: seen in room, no c/o's today. Fever 101 recorded yest, no c/o per pt.   Vitals:   08/09/18 2316 08/10/18 0223 08/10/18 0400 08/10/18 1104  BP: (!) 81/53 (!) 77/55 101/63 106/63  Pulse: (!) 113 99 93 87  Resp: (!) 26 (!) 26 (!) 22 (!) 23  Temp: (!) 100.9 F (38.3 C) 98.7 F (37.1 C) 97.8 F (36.6 C) 99.1 F (37.3 C)  TempSrc: Oral Oral Axillary Oral  SpO2:  92% 91% 92%  Weight:      Height:        Inpatient medications: . sodium chloride   Intravenous Once  . ALPRAZolam  0.25 mg Oral BID  . aspirin EC  81 mg Oral Daily  . carvedilol  6.25 mg Oral BID WC  . Chlorhexidine Gluconate Cloth  6 each Topical Q0600  . clopidogrel  75 mg Oral Daily  . heparin  5,000 Units Subcutaneous Q8H  . hydroxychloroquine  200 mg Oral Q1200  . midodrine  10 mg Oral Q1200  . multivitamin  1 tablet Oral QHS  . mupirocin ointment  1 application Nasal BID  . sevelamer carbonate  1,600 mg Oral TID WC  . sodium chloride flush  3 mL Intravenous Q12H  . [START ON 08/12/2018] Vitamin D (Ergocalciferol)  50,000 Units Oral Q Fri   . sodium chloride    . sodium chloride    . sodium chloride    . sodium chloride 10 mL/hr at 08/09/18 1525   sodium chloride, sodium chloride, sodium chloride, acetaminophen, alteplase, heparin, hydrALAZINE, labetalol, lidocaine (PF), lidocaine-prilocaine, morphine injection, ondansetron (ZOFRAN) IV, oxyCODONE, pentafluoroprop-tetrafluoroeth, sodium chloride flush  Iron/TIBC/Ferritin/ %Sat No results found for: IRON, TIBC, FERRITIN, IRONPCTSAT  Exam: General: WDWN NAD very HOH Head: NCAT sclera not icteric MMM Neck: Supple.  Lungs: CTA anteriorly and at baselines Breathing is unlabored. Heart: RRR with S1 S2. - paced rhythm on tele Abdomen: soft NT + BS Lower extremities: diffuse splotchy darkening skin discoloration x 4 ext , no LE edema - large left groin hematoma - right great toe ulcer Neuro: A & O  X 3. Moves  all extremities spontaneously. Psych:  Responds to questions appropriately with a usual affect. Dialysis Access: right IJ [facility]   Home meds:  - aspirin 81/ clopidogrel 75mg   - carvedilol 6.25 bid/ midodrine 10 qd  - sevelamer carb 800 tid/ alprazolam 0.25 bid prn  - hydroxychloroquine 200 qd  - prn's/ vitamins  TTS [place] [facility]  RIJ TDC  79kg   3h 52min  2 K 2.25 Ca  Heparin 4000 with 1000 mid tmt  - Hectorol 6 - parsabiv 5  - Mircera 30 q weeks - just resumed 2/27 - last was Dec -  hgb trending down - most recently 10.6 43% sat Ca corr 9s - P 5- 6s iPTH 339  Assessment/Plan: 1. Right great toe ulcer with PAD diffuse disease - for future toe amputation.  2. L groin hematoma: sp arteriogram 3. ESRD - TTS. HD tomorrow. 4. BP/volume  - gains low, BPs low, on long-term midodrine 5. Anemia ckd/ abl: with post procedure hematoma. Just resumed ESA 2/27, recent Fe ok, Hb down 7.7 today.  Continue to trend - transfuse prn - given cardiac hx try to keep hgb > 8 6. Metabolic bone disease -  Continue VDRA and binders - Ca may trend up as we do not have Parsabiv here to counteract her Ca levels- our records indicate she  was on 4 renvela but hard to know what she was getting at [facility] - plan follow P levels - if elevated need to increase renvela ac 7.  Nutrition - renal diet/vits 8. H/o CVA - w/ left hemiparesis   [facility] 08/10/2018, 11:54 AM  Recent Labs  Lab 08/09/18 0315 08/09/18 1318 08/09/18 1637  NA 139 139 139  K 4.4 3.3* 3.8  CL 105  --  103  CO2 22  --  23  GLUCOSE 115* 81 103*  BUN 52*  --  18  CREATININE 11.03*  --  5.42*  CALCIUM 8.3*  --  7.9*  PHOS  --   --  4.3  ALBUMIN  --   --  3.1*  INR 1.1  --   --    No results for input(s): AST, ALT, ALKPHOS, BILITOT, PROT in the last 168 hours. Recent Labs  Lab 08/09/18 1637 08/10/18 0642  WBC 9.0 8.8  HGB 6.7* 9.9*  HCT 21.3* 31.2*  MCV 95.1 92.0  PLT 95* 94*

## 2018-08-10 NOTE — Progress Notes (Signed)
   VASCULAR SURGERY ASSESSMENT & PLAN:   1 Day Post-Op s/p: Amputation of right great toe.  I inspected the wound this morning and this is healing nicely.  PHYSICAL THERAPY: She will need to pivot on her right foot partial weightbearing heel only so that she can get in and out of bed.  Physical therapy was consulted to assist her with this.  Back to skilled nursing facility once she can safely do this.  HYPOTENSION: Her blood pressure improved after 2 units of packed red blood cells last night.  Her abdomen is soft and nontender.  She has no significant swelling in the left groin.  The ecchymosis in the left groin is stable.  Her follow-up hemoglobin is pending.   SUBJECTIVE:   No complaints this morning.  PHYSICAL EXAM:   Vitals:   08/09/18 2302 08/09/18 2316 08/10/18 0223 08/10/18 0400  BP: (!) 79/48 (!) 81/53 (!) 77/55 101/63  Pulse: (!) 111 (!) 113 99 93  Resp: (!) 28 (!) 26 (!) 26 (!) 22  Temp: 100 F (37.8 C) (!) 100.9 F (38.3 C) 98.7 F (37.1 C) 97.8 F (36.6 C)  TempSrc: Axillary Oral Oral Axillary  SpO2: 91%  92% 91%  Weight:      Height:       Her right great toe amputation site looks fine.  LABS:   Lab Results  Component Value Date   WBC 9.0 08/09/2018   HGB 6.7 (LL) 08/09/2018   HCT 21.3 (L) 08/09/2018   MCV 95.1 08/09/2018   PLT 95 (L) 08/09/2018   Lab Results  Component Value Date   CREATININE 5.42 (H) 08/09/2018   Lab Results  Component Value Date   INR 1.1 08/09/2018   CBG (last 3)  Recent Labs    08/09/18 1652 08/09/18 2140 08/10/18 0620  GLUCAP 84 94 77    PROBLEM LIST:    Active Problems:   ESRD (end stage renal disease) ([place])   CURRENT MEDS:   . sodium chloride   Intravenous Once  . ALPRAZolam  0.25 mg Oral BID  . aspirin EC  81 mg Oral Daily  . carvedilol  6.25 mg Oral BID WC  . Chlorhexidine Gluconate Cloth  6 each Topical Q0600  . clopidogrel  75 mg Oral Daily  . heparin  5,000 Units Subcutaneous Q8H  .  hydroxychloroquine  200 mg Oral Q1200  . midodrine  10 mg Oral Q1200  . multivitamin  1 tablet Oral QHS  . mupirocin ointment  1 application Nasal BID  . sevelamer carbonate  1,600 mg Oral TID WC  . sodium chloride flush  3 mL Intravenous Q12H  . [START ON 08/12/2018] Vitamin D (Ergocalciferol)  50,000 Units Oral Q Darylene Price Beeper: [phone] Office: [phone] 08/10/2018

## 2018-08-10 NOTE — Progress Notes (Signed)
Patient BP continues to be low. Patient temp is 100.9 but has about five blankets and does not want to remove them. RRT response and Dr Scot Dock made aware. Patient not in distress at this time. Oriented times 4. Daughter at bedside said patient BP at baseline  Is usually Systolic 95'J and gets confused at base line sometimes. Patient currently receiving second unit of RBC's transfusion. HGB was 6.7 S/P toe amputation. Staff will continue to monitor for any further change in status.

## 2018-08-16 ENCOUNTER — Telehealth: Payer: Self-pay | Admitting: Vascular Surgery

## 2018-08-16 NOTE — Telephone Encounter (Signed)
Called [facility] about up coming appt on 08/26/18 at 8:45 am Will mail reminder letter and follow up paper

## 2018-08-26 ENCOUNTER — Ambulatory Visit (INDEPENDENT_AMBULATORY_CARE_PROVIDER_SITE_OTHER): Payer: Self-pay | Admitting: Vascular Surgery

## 2018-08-26 ENCOUNTER — Other Ambulatory Visit: Payer: Self-pay

## 2018-08-26 ENCOUNTER — Encounter: Payer: Self-pay | Admitting: Vascular Surgery

## 2018-08-26 VITALS — BP 74/56 | HR 77 | Temp 98.2°F | Resp 16 | Ht 63.0 in | Wt 178.0 lb

## 2018-08-26 DIAGNOSIS — I739 Peripheral vascular disease, unspecified: Secondary | ICD-10-CM

## 2018-08-26 DIAGNOSIS — Z992 Dependence on renal dialysis: Secondary | ICD-10-CM

## 2018-08-26 DIAGNOSIS — N186 End stage renal disease: Secondary | ICD-10-CM

## 2018-08-26 NOTE — Progress Notes (Signed)
    Subjective:     Patient ID: Abigail Buck, female   DOB: [birth year], 73 y.o.   MRN: [id number]  HPI 73 year old female follows up after right great toe amputation.  She had angiogram before this which demonstrated microvascular disease.  She is wearing a postop shoe at this time.   Review of Systems Right foot pain    Objective:   Physical Exam Vitals:   08/26/18 0838 08/26/18 0850  BP: (!) 78/53 (!) 74/56  Pulse: 77 77  Resp: 16   Temp: 98.2 F (36.8 C)   SpO2: 96%   She is awake and alert Right great toe amputation site healing well and sutures removed today.     Assessment:     73 year old female status post angiography demonstrated microvascular disease subsequent right partial great toe amputation.    Plan:     Sutures removed today She can follow-up as needed   Abigail Buck C. Donzetta Matters, MD [facility] Office: [phone] Pager: [phone]

## 2018-09-15 ENCOUNTER — Observation Stay (HOSPITAL_COMMUNITY): Payer: Medicare Other

## 2018-09-15 ENCOUNTER — Other Ambulatory Visit: Payer: Self-pay

## 2018-09-15 ENCOUNTER — Inpatient Hospital Stay (HOSPITAL_COMMUNITY)
Admission: EM | Admit: 2018-09-15 | Discharge: 2018-09-19 | DRG: 377 | Disposition: A | Discharge status: Skilled Nursing Facility | Payer: Medicare Other | Attending: Internal Medicine | Admitting: Internal Medicine

## 2018-09-15 ENCOUNTER — Encounter (HOSPITAL_COMMUNITY): Payer: Self-pay | Admitting: Emergency Medicine

## 2018-09-15 DIAGNOSIS — N2581 Secondary hyperparathyroidism of renal origin: Secondary | ICD-10-CM | POA: Diagnosis present

## 2018-09-15 DIAGNOSIS — K5730 Diverticulosis of large intestine without perforation or abscess without bleeding: Secondary | ICD-10-CM

## 2018-09-15 DIAGNOSIS — K921 Melena: Secondary | ICD-10-CM | POA: Diagnosis not present

## 2018-09-15 DIAGNOSIS — Z79899 Other long term (current) drug therapy: Secondary | ICD-10-CM

## 2018-09-15 DIAGNOSIS — K922 Gastrointestinal hemorrhage, unspecified: Secondary | ICD-10-CM | POA: Diagnosis not present

## 2018-09-15 DIAGNOSIS — I4891 Unspecified atrial fibrillation: Secondary | ICD-10-CM | POA: Diagnosis present

## 2018-09-15 DIAGNOSIS — I69354 Hemiplegia and hemiparesis following cerebral infarction affecting left non-dominant side: Secondary | ICD-10-CM

## 2018-09-15 DIAGNOSIS — I479 Paroxysmal tachycardia, unspecified: Secondary | ICD-10-CM

## 2018-09-15 DIAGNOSIS — I472 Ventricular tachycardia: Secondary | ICD-10-CM | POA: Diagnosis present

## 2018-09-15 DIAGNOSIS — Z992 Dependence on renal dialysis: Secondary | ICD-10-CM

## 2018-09-15 DIAGNOSIS — D62 Acute posthemorrhagic anemia: Secondary | ICD-10-CM

## 2018-09-15 DIAGNOSIS — M25561 Pain in right knee: Secondary | ICD-10-CM

## 2018-09-15 DIAGNOSIS — M3219 Other organ or system involvement in systemic lupus erythematosus: Secondary | ICD-10-CM | POA: Diagnosis present

## 2018-09-15 DIAGNOSIS — K648 Other hemorrhoids: Secondary | ICD-10-CM | POA: Diagnosis present

## 2018-09-15 DIAGNOSIS — K644 Residual hemorrhoidal skin tags: Secondary | ICD-10-CM | POA: Diagnosis present

## 2018-09-15 DIAGNOSIS — I959 Hypotension, unspecified: Secondary | ICD-10-CM | POA: Diagnosis present

## 2018-09-15 DIAGNOSIS — Z8249 Family history of ischemic heart disease and other diseases of the circulatory system: Secondary | ICD-10-CM

## 2018-09-15 DIAGNOSIS — G8929 Other chronic pain: Secondary | ICD-10-CM | POA: Diagnosis not present

## 2018-09-15 DIAGNOSIS — IMO0002 Reserved for concepts with insufficient information to code with codable children: Secondary | ICD-10-CM | POA: Diagnosis present

## 2018-09-15 DIAGNOSIS — I120 Hypertensive chronic kidney disease with stage 5 chronic kidney disease or end stage renal disease: Secondary | ICD-10-CM | POA: Diagnosis not present

## 2018-09-15 DIAGNOSIS — K5731 Diverticulosis of large intestine without perforation or abscess with bleeding: Secondary | ICD-10-CM | POA: Diagnosis not present

## 2018-09-15 DIAGNOSIS — Z885 Allergy status to narcotic agent status: Secondary | ICD-10-CM

## 2018-09-15 DIAGNOSIS — D638 Anemia in other chronic diseases classified elsewhere: Secondary | ICD-10-CM | POA: Diagnosis present

## 2018-09-15 DIAGNOSIS — I69341 Monoplegia of lower limb following cerebral infarction affecting right dominant side: Secondary | ICD-10-CM

## 2018-09-15 DIAGNOSIS — F0390 Unspecified dementia without behavioral disturbance: Secondary | ICD-10-CM | POA: Diagnosis present

## 2018-09-15 DIAGNOSIS — I739 Peripheral vascular disease, unspecified: Secondary | ICD-10-CM | POA: Diagnosis present

## 2018-09-15 DIAGNOSIS — I442 Atrioventricular block, complete: Secondary | ICD-10-CM

## 2018-09-15 DIAGNOSIS — Z950 Presence of cardiac pacemaker: Secondary | ICD-10-CM

## 2018-09-15 DIAGNOSIS — M329 Systemic lupus erythematosus, unspecified: Secondary | ICD-10-CM | POA: Diagnosis present

## 2018-09-15 DIAGNOSIS — I4510 Unspecified right bundle-branch block: Secondary | ICD-10-CM

## 2018-09-15 DIAGNOSIS — Z881 Allergy status to other antibiotic agents status: Secondary | ICD-10-CM

## 2018-09-15 DIAGNOSIS — I69351 Hemiplegia and hemiparesis following cerebral infarction affecting right dominant side: Secondary | ICD-10-CM

## 2018-09-15 DIAGNOSIS — K8020 Calculus of gallbladder without cholecystitis without obstruction: Secondary | ICD-10-CM | POA: Diagnosis present

## 2018-09-15 DIAGNOSIS — Z7902 Long term (current) use of antithrombotics/antiplatelets: Secondary | ICD-10-CM | POA: Diagnosis not present

## 2018-09-15 DIAGNOSIS — Z90710 Acquired absence of both cervix and uterus: Secondary | ICD-10-CM

## 2018-09-15 DIAGNOSIS — I2510 Atherosclerotic heart disease of native coronary artery without angina pectoris: Secondary | ICD-10-CM | POA: Diagnosis present

## 2018-09-15 DIAGNOSIS — Z89411 Acquired absence of right great toe: Secondary | ICD-10-CM

## 2018-09-15 DIAGNOSIS — Z7982 Long term (current) use of aspirin: Secondary | ICD-10-CM

## 2018-09-15 DIAGNOSIS — N186 End stage renal disease: Secondary | ICD-10-CM

## 2018-09-15 DIAGNOSIS — I69320 Aphasia following cerebral infarction: Secondary | ICD-10-CM

## 2018-09-15 DIAGNOSIS — Z9889 Other specified postprocedural states: Secondary | ICD-10-CM

## 2018-09-15 DIAGNOSIS — D696 Thrombocytopenia, unspecified: Secondary | ICD-10-CM | POA: Diagnosis present

## 2018-09-15 DIAGNOSIS — Z79891 Long term (current) use of opiate analgesic: Secondary | ICD-10-CM

## 2018-09-15 DIAGNOSIS — Z823 Family history of stroke: Secondary | ICD-10-CM

## 2018-09-15 DIAGNOSIS — Z66 Do not resuscitate: Secondary | ICD-10-CM | POA: Diagnosis present

## 2018-09-15 DIAGNOSIS — R350 Frequency of micturition: Secondary | ICD-10-CM | POA: Diagnosis present

## 2018-09-15 HISTORY — DX: Anemia, unspecified: D64.9

## 2018-09-15 HISTORY — DX: End stage renal disease: N18.6

## 2018-09-15 LAB — COMPREHENSIVE METABOLIC PANEL
ALT: 7 U/L (ref 0–44)
AST: 17 U/L (ref 15–41)
Albumin: 2.9 g/dL — ABNORMAL LOW (ref 3.5–5.0)
Alkaline Phosphatase: 58 U/L (ref 38–126)
Anion gap: 15 (ref 5–15)
BUN: 35 mg/dL — ABNORMAL HIGH (ref 8–23)
CO2: 26 mmol/L (ref 22–32)
Calcium: 9.3 mg/dL (ref 8.9–10.3)
Chloride: 96 mmol/L — ABNORMAL LOW (ref 98–111)
Creatinine, Ser: 8.38 mg/dL — ABNORMAL HIGH (ref 0.44–1.00)
GFR calc Af Amer: 5 mL/min — ABNORMAL LOW (ref >60)
GFR calc non Af Amer: 4 mL/min — ABNORMAL LOW (ref >60)
Glucose, Bld: 113 mg/dL — ABNORMAL HIGH (ref 70–99)
Potassium: 4.2 mmol/L (ref 3.5–5.1)
Sodium: 137 mmol/L (ref 135–145)
Total Bilirubin: 0.5 mg/dL (ref 0.3–1.2)
Total Protein: 6.3 g/dL — ABNORMAL LOW (ref 6.5–8.1)

## 2018-09-15 LAB — CBC WITH DIFFERENTIAL/PLATELET
Abs Immature Granulocytes: 0.04 K/uL (ref 0.00–0.07)
Basophils Absolute: 0 K/uL (ref 0.0–0.1)
Basophils Relative: 0 %
Eosinophils Absolute: 0.1 K/uL (ref 0.0–0.5)
Eosinophils Relative: 1 %
HCT: 26.1 % — ABNORMAL LOW (ref 36.0–46.0)
Hemoglobin: 8.2 g/dL — ABNORMAL LOW (ref 12.0–15.0)
Immature Granulocytes: 0 %
Lymphocytes Relative: 13 %
Lymphs Abs: 1.2 K/uL (ref 0.7–4.0)
MCH: 28.7 pg (ref 26.0–34.0)
MCHC: 31.4 g/dL (ref 30.0–36.0)
MCV: 91.3 fL (ref 80.0–100.0)
Monocytes Absolute: 0.5 K/uL (ref 0.1–1.0)
Monocytes Relative: 6 %
Neutro Abs: 7.5 K/uL (ref 1.7–7.7)
Neutrophils Relative %: 80 %
Platelets: 135 K/uL — ABNORMAL LOW (ref 150–400)
RBC: 2.86 MIL/uL — ABNORMAL LOW (ref 3.87–5.11)
RDW: 13.6 % (ref 11.5–15.5)
WBC: 9.4 K/uL (ref 4.0–10.5)
nRBC: 0 % (ref 0.0–0.2)

## 2018-09-15 LAB — PREPARE RBC (CROSSMATCH)

## 2018-09-15 LAB — HEMOGLOBIN AND HEMATOCRIT, BLOOD
HCT: 22.6 % — ABNORMAL LOW (ref 36.0–46.0)
HCT: 30.8 % — ABNORMAL LOW (ref 36.0–46.0)
Hemoglobin: 7 g/dL — ABNORMAL LOW (ref 12.0–15.0)
Hemoglobin: 9.8 g/dL — ABNORMAL LOW (ref 12.0–15.0)

## 2018-09-15 LAB — PROTIME-INR
INR: 1.1 (ref 0.8–1.2)
Prothrombin Time: 13.7 s (ref 11.4–15.2)

## 2018-09-15 MED ORDER — METOCLOPRAMIDE HCL 5 MG/ML IJ SOLN
5.0000 mg | Freq: Once | INTRAMUSCULAR | Status: AC
  Administered 2018-09-16: 5 mg via INTRAVENOUS
  Filled 2018-09-15: qty 2

## 2018-09-15 MED ORDER — SODIUM CHLORIDE 0.9 % IV SOLN
62.5000 mg | Freq: Once | INTRAVENOUS | Status: AC
  Administered 2018-09-15: 62.5 mg via INTRAVENOUS
  Filled 2018-09-15: qty 5

## 2018-09-15 MED ORDER — LIDOCAINE-PRILOCAINE 2.5-2.5 % EX CREA: 1.0000 "application " | TOPICAL_CREAM | CUTANEOUS | Status: DC | PRN

## 2018-09-15 MED ORDER — HEPARIN SODIUM (PORCINE) 1000 UNIT/ML IJ SOLN
4800.0000 [IU] | Freq: Once | INTRAMUSCULAR | Status: AC
  Administered 2018-09-15: 4800 [IU] via INTRAVENOUS

## 2018-09-15 MED ORDER — SODIUM CHLORIDE 0.9 % IV SOLN: 100.0000 mL | INTRAVENOUS | Status: DC | PRN

## 2018-09-15 MED ORDER — MIDODRINE HCL 5 MG PO TABS
10.0000 mg | ORAL_TABLET | ORAL | Status: DC
  Administered 2018-09-17: 10 mg via ORAL
  Filled 2018-09-15: qty 2

## 2018-09-15 MED ORDER — METOCLOPRAMIDE HCL 5 MG/ML IJ SOLN
5.0000 mg | Freq: Once | INTRAMUSCULAR | Status: AC
  Administered 2018-09-15: 5 mg via INTRAVENOUS
  Filled 2018-09-15: qty 2

## 2018-09-15 MED ORDER — PENTAFLUOROPROP-TETRAFLUOROETH EX AERO: 1.0000 "application " | INHALATION_SPRAY | CUTANEOUS | Status: DC | PRN

## 2018-09-15 MED ORDER — DARBEPOETIN ALFA 60 MCG/0.3ML IJ SOSY
60.0000 ug | PREFILLED_SYRINGE | INTRAMUSCULAR | Status: DC
  Administered 2018-09-15: 60 ug via INTRAVENOUS
  Filled 2018-09-15: qty 0.3

## 2018-09-15 MED ORDER — SODIUM CHLORIDE 0.9% IV SOLUTION
Freq: Once | INTRAVENOUS | Status: AC
  Administered 2018-09-15: 19:00:00 via INTRAVENOUS

## 2018-09-15 MED ORDER — DARBEPOETIN ALFA 60 MCG/0.3ML IJ SOSY: 60.0000 ug | PREFILLED_SYRINGE | INTRAMUSCULAR | Status: DC

## 2018-09-15 MED ORDER — BISACODYL 5 MG PO TBEC
20.0000 mg | DELAYED_RELEASE_TABLET | Freq: Once | ORAL | Status: AC
  Administered 2018-09-15: 20 mg via ORAL
  Filled 2018-09-15: qty 4

## 2018-09-15 MED ORDER — PEG-KCL-NACL-NASULF-NA ASC-C 100 G PO SOLR
1.0000 | Freq: Once | ORAL | Status: DC
  Filled 2018-09-15: qty 1

## 2018-09-15 MED ORDER — DARBEPOETIN ALFA 60 MCG/0.3ML IJ SOSY
PREFILLED_SYRINGE | INTRAMUSCULAR | Status: AC
  Filled 2018-09-15: qty 0.3

## 2018-09-15 MED ORDER — OXYCODONE HCL 5 MG PO TABS: 5.0000 mg | ORAL_TABLET | Freq: Four times a day (QID) | ORAL | Status: DC | PRN

## 2018-09-15 MED ORDER — HEPARIN SODIUM (PORCINE) 1000 UNIT/ML DIALYSIS: 1000.0000 [IU] | INTRAMUSCULAR | Status: DC | PRN

## 2018-09-15 MED ORDER — CHLORHEXIDINE GLUCONATE 0.12 % MT SOLN
15.0000 mL | Freq: Two times a day (BID) | OROMUCOSAL | Status: DC
  Administered 2018-09-16 – 2018-09-19 (×5): 15 mL via OROMUCOSAL
  Filled 2018-09-15 (×7): qty 15

## 2018-09-15 MED ORDER — CHLORHEXIDINE GLUCONATE CLOTH 2 % EX PADS
6.0000 | MEDICATED_PAD | Freq: Every day | CUTANEOUS | Status: DC
  Administered 2018-09-18 – 2018-09-19 (×2): 6 via TOPICAL

## 2018-09-15 MED ORDER — SEVELAMER CARBONATE 800 MG PO TABS
800.0000 mg | ORAL_TABLET | Freq: Three times a day (TID) | ORAL | Status: DC
  Administered 2018-09-15 – 2018-09-19 (×10): 800 mg via ORAL
  Filled 2018-09-15 (×10): qty 1

## 2018-09-15 MED ORDER — SODIUM CHLORIDE 0.9 % IV BOLUS
500.0000 mL | Freq: Once | INTRAVENOUS | Status: AC
  Administered 2018-09-15: 500 mL via INTRAVENOUS

## 2018-09-15 MED ORDER — ACETAMINOPHEN 325 MG PO TABS: 650.0000 mg | ORAL_TABLET | Freq: Four times a day (QID) | ORAL | Status: DC | PRN

## 2018-09-15 MED ORDER — ALTEPLASE 2 MG IJ SOLR: 2.0000 mg | Freq: Once | INTRAMUSCULAR | Status: DC | PRN

## 2018-09-15 MED ORDER — LIDOCAINE HCL (PF) 1 % IJ SOLN: 5.0000 mL | INTRAMUSCULAR | Status: DC | PRN

## 2018-09-15 MED ORDER — HEPARIN SODIUM (PORCINE) 1000 UNIT/ML IJ SOLN
INTRAMUSCULAR | Status: AC
  Filled 2018-09-15: qty 5

## 2018-09-15 MED ORDER — ACETAMINOPHEN 650 MG RE SUPP: 650.0000 mg | Freq: Four times a day (QID) | RECTAL | Status: DC | PRN

## 2018-09-15 MED ORDER — ORAL CARE MOUTH RINSE
15.0000 mL | Freq: Two times a day (BID) | OROMUCOSAL | Status: DC
  Administered 2018-09-16 – 2018-09-19 (×5): 15 mL via OROMUCOSAL

## 2018-09-15 MED ORDER — HYDROXYCHLOROQUINE SULFATE 200 MG PO TABS
200.0000 mg | ORAL_TABLET | Freq: Every day | ORAL | Status: DC
  Administered 2018-09-15 – 2018-09-19 (×5): 200 mg via ORAL
  Filled 2018-09-15 (×5): qty 1

## 2018-09-15 NOTE — Progress Notes (Signed)
PT Cancellation Note  Patient Details Name: Abigail Buck MRN: [id number] DOB: Mar 07, 1946   Cancelled Treatment:    Reason Eval/Treat Not Completed: Patient at procedure or test/unavailable Pt currently at HD. Will follow up as pt available and as schedule allows.   Leighton Ruff, PT, DPT  Acute Rehabilitation Services  Pager: [phone] Office: [phone]    Rudean Hitt 09/15/2018, 3:57 PM

## 2018-09-15 NOTE — ED Provider Notes (Signed)
[facility] EMERGENCY DEPARTMENT Provider Note  CSN: [id number] Arrival date & time: 09/15/18 0423  Chief Complaint(s) Rectal Bleeding  HPI Abigail Buck is a 73 y.o. female with a past medical history listed below including ESL, ESRD on dialysis Tuesday, Thursday, Saturday through a left chest permacath who presents to the emergency department from skilled nursing facility due to bloody bowel movement just prior to arrival.  This is the only episode of hematochezia.  Per EMS, nursing facility reported approximately 500 cc of blood in the bowel movement.  Patient denied any preceding abdominal pain but did endorse abdominal discomfort shortly after the bloody bowel movement, which has since resolved.  Currently she denies any physical complaints.  She denies any recent fevers or infections.  No chest pain or shortness of breath.  Patient does not currently make urine.  States that her last colonoscopy was likely more than 10 years ago and was never told she had polyps or diverticulosis.  Patient is not anticoagulated.  Of note patient was admitted last month for peripheral vascular disease and had right great toe amputation.  Following operation, patient was noted to be anemic requiring 2 units of packed red blood cells.  Postop phase has been stable and patient has not had any complications following her amputation.  EMS reported initial systolic blood pressure in the 80s which improved without intervention.  Last systolic blood pressure was 107.  Of note patient does have history of hypotension on daily Midodrine.  States that her usual blood pressures are about 120  HPI  Past Medical History Past Medical History:  Diagnosis Date  . Chronic kidney disease   . Lupus [facility])    Patient Active Problem List   Diagnosis Date Noted  . ESRD (end stage renal disease) ([place]) 08/08/2018  . History of cerebral infarction   . Chest pain 06/21/2018  . Lupus ([place])   . Elevated troponin    . Artificial pacemaker   . Right bundle branch block   . ESRD (end stage renal disease) on dialysis ([place]) 03/12/2017   Home Medication(s) Prior to Admission medications   Medication Sig Start Date End Date Taking? Authorizing Provider  acetaminophen (TYLENOL) 325 MG tablet Take 650 mg by mouth every 4 (four) hours as needed (general discomfort).    [provider]  ALPRAZolam Duanne Moron) 0.25 MG tablet Take 0.25 mg by mouth 2 (two) times daily.  10/14/17   [provider]  aspirin EC 81 MG tablet Take 81 mg by mouth daily.    [provider]  carvedilol (COREG) 6.25 MG tablet Take 6.25 mg by mouth 2 (two) times daily with a meal.    [provider]  clopidogrel (PLAVIX) 75 MG tablet Take 75 mg by mouth daily.    [provider]  ergocalciferol (VITAMIN D2) 50000 units capsule Take 50,000 Units by mouth every Friday.     [provider]  hydroxychloroquine (PLAQUENIL) 200 MG tablet Take 200 mg by mouth daily at 12 noon.     [provider]  magnesium hydroxide (MILK OF MAGNESIA) 400 MG/5ML suspension Take 15 mLs by mouth daily as needed (for 3 days - standing order for constipation).    [provider]  midodrine (PROAMATINE) 10 MG tablet Take 10 mg by mouth daily at 12 noon.     [provider]  oxyCODONE (OXY IR/ROXICODONE) 5 MG immediate release tablet Take 1-2 tablets (5-10 mg total) by mouth every 4 (four) hours as needed  for moderate pain. 08/10/18   Dagoberto Ligas, PA-C  sevelamer carbonate (RENVELA) 800 MG tablet Take 800 mg by mouth 3 (three) times daily with meals.    [provider]                                                                                                                                    Past Surgical History Past Surgical History:  Procedure Laterality Date  . ABDOMINAL AORTOGRAM W/LOWER EXTREMITY Bilateral 08/08/2018   Procedure: ABDOMINAL AORTOGRAM W/LOWER EXTREMITY;   Surgeon: [name], MD;  Location: [facility];  Service: Cardiovascular;  Laterality: Bilateral;  . ABDOMINAL HYSTERECTOMY  1986  . AMPUTATION Right 08/09/2018   Procedure: AMPUTATION RIGHT GREAT TOE;  Surgeon: Angelia Mould, MD;  Location: [facility];  Service: Vascular;  Laterality: Right;  . PACEMAKER IMPLANT  10/08/2017   Done at [facility]   Family History Family History  Problem Relation Age of Onset  . Stroke Mother   . Hypertension Mother   . Heart murmur Father     Social History Social History   Tobacco Use  . Smoking status: Never Smoker  . Smokeless tobacco: Never Used  Substance Use Topics  . Alcohol use: No  . Drug use: No   Allergies Morphine and Clindamycin/lincomycin  Review of Systems Review of Systems All other systems are reviewed and are negative for acute change except as noted in the HPI  Physical Exam Vital Signs  I have reviewed the triage vital signs BP 113/72   Pulse 74   Temp 98.5 F (36.9 C) (Oral)   Resp 18   Ht 5\' 3"  (1.6 m)   Wt 80 kg   SpO2 100%   BMI 31.24 kg/m   Physical Exam Vitals signs reviewed.  Constitutional:      General: She is not in acute distress.    Appearance: She is well-developed. She is not diaphoretic.  HENT:     Head: Normocephalic and atraumatic.     Nose: Nose normal.  Eyes:     General: No scleral icterus.       Right eye: No discharge.        Left eye: No discharge.     Conjunctiva/sclera: Conjunctivae normal.     Pupils: Pupils are equal, round, and reactive to light.  Neck:     Musculoskeletal: Normal range of motion and neck supple.  Cardiovascular:     Rate and Rhythm: Normal rate and regular rhythm.     Heart sounds: No murmur. No friction rub. No gallop.   Pulmonary:     Effort: Pulmonary effort is normal. No respiratory distress.     Breath sounds: Normal breath sounds. No stridor. No rales.  Chest:    Abdominal:     General: There is no distension.      Palpations: Abdomen is soft.     Tenderness: There is  no abdominal tenderness. There is no guarding or rebound.  Musculoskeletal:        General: No tenderness.       Feet:  Skin:    General: Skin is warm and dry.     Findings: No erythema or rash.  Neurological:     Mental Status: She is alert and oriented to person, place, and time.     ED Results and Treatments Labs (all labs ordered are listed, but only abnormal results are displayed) Labs Reviewed  COMPREHENSIVE METABOLIC PANEL - Abnormal; Notable for the following components:      Result Value   Chloride 96 (*)    Glucose, Bld 113 (*)    BUN 35 (*)    Creatinine, Ser 8.38 (*)    Total Protein 6.3 (*)    Albumin 2.9 (*)    GFR calc non Af Amer 4 (*)    GFR calc Af Amer 5 (*)    All other components within normal limits  CBC WITH DIFFERENTIAL/PLATELET - Abnormal; Notable for the following components:   RBC 2.86 (*)    Hemoglobin 8.2 (*)    HCT 26.1 (*)    Platelets 135 (*)    All other components within normal limits  PROTIME-INR  HEMOGLOBIN AND HEMATOCRIT, BLOOD  TYPE AND SCREEN                                                                                                                         EKG  EKG Interpretation  Date/Time:  Thursday September 15 2018 04:35:28 EDT Ventricular Rate:  79 PR Interval:    QRS Duration: 140 QT Interval:  481 QTC Calculation: 552 R Axis:   -80 Text Interpretation:  Sinus rhythm RBBB and LAFB Left ventricular hypertrophy No significant change since last tracing Confirmed by Addison Lank [phone]) on 09/15/2018 5:34:01 AM      Radiology No results found. Pertinent labs & imaging results that were available during my care of the patient were reviewed by me and considered in my medical decision making (see chart for details).  Medications Ordered in ED Medications  sodium chloride 0.9 % bolus 500 mL (500 mLs Intravenous New Bag/Given 09/15/18 0541)  Procedures Procedures Emergency Ultrasound Study:   Angiocath insertion Performed by: Fatima Blank  Consent: Verbal consent obtained. Risks and benefits: risks, benefits and alternatives were discussed Immediately prior to procedure the correct patient, procedure, equipment, support staff and site/side marked as needed.  Indication: difficult IV access Preparation: Patient and probe were prepped with chlorhexidine or alcohol. Sterile gel used. Vein Location: right cephalic vein was visualized during assessment for potential access sites and was found to be patent/ easily compressed with linear ultrasound.  1.88 inch, 20 gauge needle was visualized with real-time ultrasound and guided into the vein.  Image saved and stored.  Normal blood return.  Patient tolerance: Patient tolerated the procedure well with no immediate complications.   (including critical care time)  Medical Decision Making / ED Course I have reviewed the nursing notes for this encounter and the patient's prior records (if available in EHR or on provided paperwork).    Patient presents with episode of hematochezia.  Large amounts per report from skilled nursing facility.  On exam patient has gross dried blood on her perineum.  Not actively bleeding at this time.  Screening labs were obtained and showed baseline hemoglobin of 8.2.  Metabolic panel consistent with her ESRD but had normal potassium.  Patient is not having any respiratory distress and does not require any emergent dialysis.  Patient is scheduled for dialysis today.  Patient was noted to be hypotensive in the emergency department.  This improved with a small IV fluid bolus.   Abdomen is benign at this time.  After long discussion with patient and family regarding admission versus discharge home and the setting of COVID-19 pandemic, family and patient  were I am able to monitoring in the emergency department for any additional bloody bowel movements and repeat hemoglobin which should be drawn at 10 AM, 5 hours after the initial 1.  Give this is stable, patient should be discharged back to the skilled nursing facility.  They already have strict return precautions.   Patient care turned over to Dr Melina Copa at 0730. Patient case and results discussed in detail; please see their note for further ED managment.      Final Clinical Impression(s) / ED Diagnoses Final diagnoses:  Lower GI bleed      This chart was dictated using voice recognition software.  Despite best efforts to proofread,  errors can occur which can change the documentation meaning.   Fatima Blank, MD 09/15/18 [phone]

## 2018-09-15 NOTE — Procedures (Signed)
Patient was seen on dialysis and the procedure was supervised.  BFR 400  Via TDC BP is  115/64.   Patient appears to be tolerating treatment well- right now heaving some- denies abdominal pain.  Admitted for BRBPR and hgb of 7 to get blood today as well   Louis Meckel 09/15/2018

## 2018-09-15 NOTE — H&P (Signed)
Date: 09/15/2018               Patient Name:  Abigail Buck MRN: [id number]  DOB: 02/05/1946 Age / Sex: 73 y.o., female   PCP: System, Pcp Not In         Medical Service: Internal Medicine Teaching Service         Attending Physician: Dr. Aldine Contes, MD    First Contact: Dr. Donne Hazel  Pager: [phone]  Second Contact: Dr. 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 She presents from [facility] after an episode of hematochezia this morning. She describes feeling restless overnight, she had increased urinary frequency. This morning at 2 am she woke up and went to have a bowel movement on the toilet which was noted to have associated bright red blood. She has had a similar episode in the past, the last time was in 2002. She can not recall having a colonoscopy in the past. She denies abdominal pain, fevers, night sweats or weight loss. She also notes that shes had right knee pain since the time of prior hospitalization in January. The pain is worse over the posterior portion of her knee and limits her ability to straighten the right knee.   In the ED she initially had a borderline low blood pressure, she is afebrile with normal HR. Labs revealed a hemoglobin of 8.2, BUN 35, K 4.2 . Follow up H&H in the ED 5 hours later revealed a hemoglobin of 7. EKG was consistent with baseline.   Meds:  Current Meds  Medication Sig  . acetaminophen (TYLENOL) 325 MG tablet Take 650 mg by mouth every 4 (four) hours as needed (general discomfort).  . ALPRAZolam (XANAX) 0.25 MG tablet Take 0.25 mg by mouth 2 (two)  times daily.   Marland Kitchen aspirin EC 81 MG tablet Take 81 mg by mouth daily.  . carvedilol (COREG) 6.25 MG tablet Take 6.25 mg by mouth 2 (two) times daily with a meal.  . clopidogrel (PLAVIX) 75 MG tablet Take 75 mg by mouth daily.  . ergocalciferol (VITAMIN D2) 50000 units capsule Take 50,000 Units by mouth every Friday.   . hydroxychloroquine (PLAQUENIL) 200 MG tablet Take 200 mg by mouth daily at 12 noon.   . midodrine (PROAMATINE) 10 MG tablet Take 10 mg by mouth daily at 12 noon.   Marland Kitchen oxyCODONE (OXY IR/ROXICODONE) 5 MG immediate release tablet Take 1-2 tablets (5-10 mg total) by mouth every 4 (four) hours as needed for moderate pain.  . sevelamer carbonate (RENVELA) 800 MG tablet Take 800 mg by mouth 3 (three) times daily with meals.   Allergies: Allergies as of 09/15/2018 - Review Complete 09/15/2018  Allergen Reaction Noted  . Morphine Other (See Comments) 01/27/2017  . Clindamycin/lincomycin  08/01/2018   Past Medical History:  Diagnosis Date  . Anemia 07/2018   Transfused 2 PRBCs.  . ESRD (end stage renal disease) (HCC)    TTS HD  . Lupus ([place])     Family History:  Family History  Problem Relation Age of Onset  . Stroke Mother   .  Hypertension Mother   . Heart murmur Father    Social History: She lives at [place]. She does not smoke or drink alcohol, she denies illicit drug use.   Review of Systems: A complete ROS was negative except as per HPI.  Physical Exam: Blood pressure 115/64, pulse 84, temperature 97.9 F (36.6 C), temperature source Oral, resp. rate 17, height 5\' 6"  (1.676 m), weight 76.9 kg, SpO2 99 %. General: well appearing, not in acute distress  HEENT: non icteric, EOMI  Cardiac: regular rate and rhythm, no murmurs  Pulm: normal work of breathing, lungs clear to auscultation over anterior lung fields  GI: abdomen is soft, non tender, non distended  GU: there is dried blood over the perinium and outer labia, there does not appear to be  blood within the labia  Extremities: bilateral lower extremities cool to touch, sp right great toe amputation, right knee is warmer than the left, there is tenderness to palpation over the joint line and posterior right knee, she has pain with extension of the knee, there is not a palpable effusion  Neuro: awake and alert, oriented to person and place   EKG: personally reviewed my interpretation is sinus rhythm, RBBB   Assessment & Plan by Problem:    Acute lower GI bleeding 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 She was brought in via EMS after an episode of bright red blood per rectum. There are no prior EGD or colonoscopy in our system. Blood pressure was low normal at presentation, she is on midodrine at home. Initial hemoglobin was 8.2 then trended down to 7. 1 unit of blood was ordered  - follow up post transfusion H&H  - GI consulted, we appreciate their recommendations     ESRD (end stage renal disease) on dialysis [facility]) Patient is on a Tuesday Thursday Saturday schedule. She has not missed any HD. She has a normal potassium, elevated BUN. She does not seem to be volume overloaded or encephalopathic.  - Nephrology consulted, we appreciate their recommendations     Lupus ([place]) Lupus is stable, exam and history not consistent with acute flair.  - continue home plaquenil   Dispo: Admit patient to Observation with expected length of stay less than 2 midnights.  Signed: Ledell Noss, MD 09/15/2018, 2:39 PM  Pager: [phone]

## 2018-09-15 NOTE — Consult Note (Addendum)
[facility] Renal Consultation Note    Indication for Consultation:  Management of ESRD/hemodialysis; anemia, hypertension/volume and secondary hyperparathyroidism PCP: No PCP on file  HPI: Abigail Buck is a 73 y.o. female with ESRD on hemodialysis T,Th,S at [facility]. Last HD 09/13/2018. PMH significant for SLE, Lupus Cerebritis, dementia, CAD, AFib, AOCD, SHPT. 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 Scot Dock.   Patient presented to ED this AM from SNF with C/O bright red blood from rectum. Patient states she had bleeding from vagina however spoke with admitting physician who confirms one bloody stool this AM at SNF. Labs unremarkable except for HGB 8.2 on admission. Repeat H & H 7.0 at 1000 so actually may be lower. She has been admitted as observation patient for BRBPR. She is hemodynamically stable,  A & O X 3. She denies abdominal pain, previous H/O GIB, says her R leg hurts behind R knee. She denies chest pain, fever, chills, SOB. She missed HD this AM. Will have HD today on schedule and receive PRBCS.   Past Medical History:  Diagnosis Date  . Chronic kidney disease   . Lupus [facility])    Past Surgical History:  Procedure Laterality Date  . ABDOMINAL AORTOGRAM W/LOWER EXTREMITY Bilateral 08/08/2018   Procedure: ABDOMINAL AORTOGRAM W/LOWER EXTREMITY;  Surgeon: [name], MD;  Location: [place] CV LAB;  Service: Cardiovascular;  Laterality: Bilateral;  . ABDOMINAL HYSTERECTOMY  1986  . AMPUTATION Right 08/09/2018   Procedure: AMPUTATION RIGHT GREAT TOE;  Surgeon: Angelia Mould, MD;  Location: [place];  Service: Vascular;  Laterality: Right;  . PACEMAKER IMPLANT  10/08/2017   Done at [facility]   Family History  Problem Relation Age of Onset  . Stroke Mother   . Hypertension Mother   . Heart murmur Father    Social History:  reports that she has never smoked. She has never used smokeless tobacco. She reports that she does not drink  alcohol or use drugs. Allergies  Allergen Reactions  . Morphine Other (See Comments)    Opposite effect makes her agressive  . Clindamycin/Lincomycin     Listed on MAR   Prior to Admission medications   Medication Sig Start Date End Date Taking? Authorizing Provider  acetaminophen (TYLENOL) 325 MG tablet Take 650 mg by mouth every 4 (four) hours as needed (general discomfort).   Yes [provider]  ALPRAZolam (XANAX) 0.25 MG tablet Take 0.25 mg by mouth 2 (two) times daily.  10/14/17  Yes [provider]  aspirin EC 81 MG tablet Take 81 mg by mouth daily.   Yes [provider]  carvedilol (COREG) 6.25 MG tablet Take 6.25 mg by mouth 2 (two) times daily with a meal.   Yes [provider]  clopidogrel (PLAVIX) 75 MG tablet Take 75 mg by mouth daily.   Yes [provider]  ergocalciferol (VITAMIN D2) 50000 units capsule Take 50,000 Units by mouth every Friday.    Yes [provider]  hydroxychloroquine (PLAQUENIL) 200 MG tablet Take 200 mg by mouth daily at 12 noon.    Yes [provider]  midodrine (PROAMATINE) 10 MG tablet Take 10 mg by mouth daily at 12 noon.    Yes [provider]  oxyCODONE (OXY IR/ROXICODONE) 5 MG immediate release tablet Take 1-2 tablets (5-10 mg total) by mouth every 4 (four) hours as needed for moderate pain. 08/10/18  Yes Dagoberto Ligas, PA-C  sevelamer carbonate (RENVELA) 800 MG tablet Take 800 mg  by mouth 3 (three) times daily with meals.   Yes [provider]   Current Facility-Administered Medications  Medication Dose Route Frequency Provider Last Rate Last Dose  . 0.9 %  sodium chloride infusion (Manually program via Guardrails IV Fluids)   Intravenous Once Ledell Noss, MD      . acetaminophen (TYLENOL) tablet 650 mg  650 mg Oral Q6H PRN Ledell Noss, MD       Or  . acetaminophen (TYLENOL) suppository 650 mg  650 mg Rectal Q6H PRN Ledell Noss, MD      . hydroxychloroquine (PLAQUENIL)  tablet 200 mg  200 mg Oral Q1200 Ledell Noss, MD      . sevelamer carbonate (RENVELA) tablet 800 mg  800 mg Oral TID WC Ledell Noss, MD       Labs: Basic Metabolic Panel: Recent Labs  Lab 09/15/18 0536  NA 137  K 4.2  CL 96*  CO2 26  GLUCOSE 113*  BUN 35*  CREATININE 8.38*  CALCIUM 9.3   Liver Function Tests: Recent Labs  Lab 09/15/18 0536  AST 17  ALT 7  ALKPHOS 58  BILITOT 0.5  PROT 6.3*  ALBUMIN 2.9*   No results for input(s): LIPASE, AMYLASE in the last 168 hours. No results for input(s): AMMONIA in the last 168 hours. CBC: Recent Labs  Lab 09/15/18 0536 09/15/18 1002  WBC 9.4  --   NEUTROABS 7.5  --   HGB 8.2* 7.0*  HCT 26.1* 22.6*  MCV 91.3  --   PLT 135*  --    Cardiac Enzymes: No results for input(s): CKTOTAL, CKMB, CKMBINDEX, TROPONINI in the last 168 hours. CBG: No results for input(s): GLUCAP in the last 168 hours. Iron Studies: No results for input(s): IRON, TIBC, TRANSFERRIN, FERRITIN in the last 72 hours. Studies/Results: No results found.  ROS: As per HPI otherwise negative.   Physical Exam: Vitals:   09/15/18 1030 09/15/18 1115 09/15/18 1200 09/15/18 1255  BP: (!) 109/42 (!) 115/57 (!) 142/77 115/64  Pulse: 80 73 93 84  Resp: 13 14 17    Temp:    97.9 F (36.6 C)  TempSrc:    Oral  SpO2: 100% 100% 100% 99%  Weight:   76.9 kg   Height:   5\' 6"  (1.676 m)      General: Pleasant older female no acute distress. Head: Normocephalic, atraumatic, sclera non-icteric, mucus membranes are moist Neck: Supple. JVD not elevated. Lungs: Bilateral breath sounds with very few bibasilar crackles. No WOB.  Heart: RRR with S1 S2. No murmurs, rubs, or gallops appreciated. Abdomen: Soft, non-tender, non-distended with normoactive bowel sounds. No rebound/guarding. No obvious abdominal masses. M-S: Difficult to move LE. Possible beginning contractures.  Lower extremities: Trace BLE pre-tib edema. BLE cool to touch. R toe amp with suture line  intact Neuro: Alert and oriented X 3. Moves all extremities spontaneously. Psych:  Responds to questions appropriately with a normal affect. Dialysis Access: RIJ TDC drsg soiled and loose.   Dialysis Orders: [place] T,Th,S 3 hr 15 min 180NRe 400/800 79.8 kg. 2.0 K/ 2.25 Ca -Heparin 4000 units IV initial bolus Heparin 1000 units IV mid run TIW -Mircera 60 mcg IV q 4 week (last dose 08/18/2018-due today) -Venofer 50 mg IV weekly (Start today)  Assessment/Plan: 1.  BRBPR- No further bloody stools since adm. HGB 7.0. Transfuse 1 unit of PRBCs. Pt on plavix. GI consult pending. Per primary.  2.  ESRD -  T,Th,S via TDC. No heparin. K+ 4.2  Use 2.0 K bath.   3.  Hypertension/volume  -BP controlled, not overtly volume overloaded. Has been leaving under EDW at OP clinic. Pre wt 77.2 kg.  Attempt 1.5 liters. Lower EDW on DC. On midodrine 10 mg PO T,Th,S prior to HD. Low dose carvedilol.  4.  Anemia  - As noted above. Give ESA and Venofer with HD today. Follow HGB 5.  Metabolic bone disease -Ca 9.3 Recent OP phos at goal. Continue renvela binders, VDRA.  6.  Nutrition - Albumin 2.9 Renal diet with fld restrictions, prostat, renal vits. 7.  SLE-per primary. On Plaquenil.  8.  PAD-S/P R toe amp. Per primary.   Feras Gardella H. Owens Shark, NP-C 09/15/2018, 1:25 PM  [place]

## 2018-09-15 NOTE — Consult Note (Signed)
Gastroenterology Inpatient Consultation   Attending Requesting Consult Abigail Buck, [place] Hospital Day: 1  Reason for Consult Anemia in setting of hematochezia   History of Present Illness  Abigail Buck is a 73 y.o. female with a pmh significant for end-stage renal disease on HD, lupus, history of CVA on Plavix/aspirin status post PPM.  The GI service is consulted for evaluation and management of anemia in the setting of hematochezia.  The patient was evaluated in hemodialysis today.  The patient's history is such that she had acute onset of bright red blood per rectum on the day of her admission.  Initially the patient was not clear if she was having vaginal bleeding.  She continued to have bouts of bleeding and came in for further evaluation.  The patient cannot recall having any significant abdominal pain with this.  She is never had this type of bleeding before.  The patient feels better with the blood having been given.  She has no signs of presyncope.  The patient has had prior CT scans without contrast at outside facilities which have never shown diverticulosis.  The patient takes Plavix and is on aspirin because of a history of's prior CVA.  She cannot recall ever having an upper endoscopy and is not clear on having had a prior colonoscopy.  GI Review of Systems Positive as above Negative for pyrosis, dysphagia, odynophagia, early satiety, weight loss, constipation, diarrhea   Problem List   Patient Active Problem List   Diagnosis Date Noted   Acute GI bleeding 09/15/2018   ESRD (end stage renal disease) ([place]) 08/08/2018   History of cerebral infarction    Chest pain 06/21/2018   Lupus ([place])    Elevated troponin    Artificial pacemaker    Right bundle branch block    ESRD (end stage renal disease) on dialysis ([place]) 03/12/2017     Histories  Past Medical History Past Medical History:  Diagnosis Date   Anemia 07/2018   Transfused 2 PRBCs.    ESRD (end stage renal disease) ([place])    TTS HD   Lupus [facility])    Past Surgical History:  Procedure Laterality Date   ABDOMINAL AORTOGRAM W/LOWER EXTREMITY Bilateral 08/08/2018   Procedure: ABDOMINAL AORTOGRAM W/LOWER EXTREMITY;  Surgeon: [name], MD;  Location: [place] CV LAB;  Service: Cardiovascular;  Laterality: Bilateral;   ABDOMINAL HYSTERECTOMY  1986   AMPUTATION Right 08/09/2018   Procedure: AMPUTATION RIGHT GREAT TOE;  Surgeon: Angelia Mould, MD;  [facility];  Service: Vascular;  Laterality: Right;   PACEMAKER IMPLANT  10/08/2017   Done at [facility]    Allergies Allergies  Allergen Reactions   Morphine Other (See Comments)    Opposite effect makes her agressive   Clindamycin/Lincomycin     Listed on MAR    Family History Family History  Problem Relation Age of Onset   Stroke Mother    Hypertension Mother    Heart murmur Father   The patient's FH is negative for IBD/GI Malignancies.   Social History Social History   Socioeconomic History   Marital status: Single    Spouse name: Not on file   Number of children: Not on file   Years of education: Not on file   Highest education level: Not on file  Occupational History   Not on file  Social Needs   Financial resource strain: Not on file   Food insecurity:    Worry: Not on file  Inability: Not on file   Transportation needs:    Medical: Not on file    Non-medical: Not on file  Tobacco Use   Smoking status: Never Smoker   Smokeless tobacco: Never Used  Substance and Sexual Activity   Alcohol use: No   Drug use: No   Sexual activity: Not Currently  Lifestyle   Physical activity:    Days per week: Not on file    Minutes per session: Not on file   Stress: Not on file  Relationships   Social connections:    Talks on phone: Not on file    Gets together: Not on file    Attends religious service: Not on file    Active member of club or  organization: Not on file    Attends meetings of clubs or organizations: Not on file    Relationship status: Not on file   Intimate partner violence:    Fear of current or ex partner: Not on file    Emotionally abused: Not on file    Physically abused: Not on file    Forced sexual activity: Not on file  Other Topics Concern   Not on file  Social History Narrative   Not on file    Medications  Home Medications No current facility-administered medications on file prior to encounter.    Current Outpatient Medications on File Prior to Encounter  Medication Sig Dispense Refill   acetaminophen (TYLENOL) 325 MG tablet Take 650 mg by mouth every 4 (four) hours as needed (general discomfort).     ALPRAZolam (XANAX) 0.25 MG tablet Take 0.25 mg by mouth 2 (two) times daily.      aspirin EC 81 MG tablet Take 81 mg by mouth daily.     carvedilol (COREG) 6.25 MG tablet Take 6.25 mg by mouth 2 (two) times daily with a meal.     clopidogrel (PLAVIX) 75 MG tablet Take 75 mg by mouth daily.     ergocalciferol (VITAMIN D2) 50000 units capsule Take 50,000 Units by mouth every Friday.      hydroxychloroquine (PLAQUENIL) 200 MG tablet Take 200 mg by mouth daily at 12 noon.      midodrine (PROAMATINE) 10 MG tablet Take 10 mg by mouth daily at 12 noon.      oxyCODONE (OXY IR/ROXICODONE) 5 MG immediate release tablet Take 1-2 tablets (5-10 mg total) by mouth every 4 (four) hours as needed for moderate pain. 20 tablet 0   sevelamer carbonate (RENVELA) 800 MG tablet Take 800 mg by mouth 3 (three) times daily with meals.     Scheduled Inpatient Medications  sodium chloride   Intravenous Once   bisacodyl  20 mg Oral Once   [START ON 09/16/2018] Chlorhexidine Gluconate Cloth  6 each Topical Q0600   darbepoetin (ARANESP) injection - DIALYSIS  60 mcg Intravenous Q Thu-HD   hydroxychloroquine  200 mg Oral Q1200   metoCLOPramide (REGLAN) injection  5 mg Intravenous Once   Followed by    metoCLOPramide (REGLAN) injection  5 mg Intravenous Once   [START ON 09/17/2018] midodrine  10 mg Oral Q T,Th,Sa-HD   peg 3350 powder  1 kit Oral Once   sevelamer carbonate  800 mg Oral TID WC   Continuous Inpatient Infusions  ferric gluconate (FERRLECIT/NULECIT) IV     PRN Inpatient Medications acetaminophen **OR** acetaminophen, oxyCODONE   Review of Systems  General: Denies fevers/chills/weight loss HEENT: Denies oral lesions Cardiovascular: Denies current chest pain Pulmonary: Denies shortness of breath  Gastroenterological: See HPI Genitourinary: Denies darkened urine Hematological: Positive for easy bruising/bleeding due to Plavix Endocrine: Denies temperature intolerance Dermatological: Denies jaundice Psychological: Mood is stable   Physical Examination  BP 108/81 (BP Location: Right Arm)    Pulse 92    Temp 97.8 F (36.6 C) (Oral)    Resp 17    Ht '5\' 6"'  (1.676 m)    Wt 76.9 kg    SpO2 99%    BMI 27.36 kg/m  GEN: NAD, appears comfortable while getting HD today PSYCH: Cooperative, without pressured speech EYE: Conjunctivae pink, sclerae anicteric ENT: MMM, without oral ulcers NECK: Enlarged neck girth CV: Non-tachycardic RESP: Decreased breath sounds at the bases bilaterally GI: NABS, soft, protuberant, non-tender, without rebound or guarding, unable to appreciate hepatosplenomegaly due to body habitus GU: DRE not performed as she was actively getting hemodialysis but had been previously done by admitting service with notation of old blood in rectum MSK/EXT: Bilateral lower extremity edema present SKIN: No jaundice NEURO:  Alert & Oriented x 3, no focal deficits   Review of Data  I reviewed the following data at the time of this encounter:  Laboratory Studies   Recent Labs  Lab 09/15/18 0536  NA 137  K 4.2  CL 96*  CO2 26  BUN 35*  CREATININE 8.38*  GLUCOSE 113*  CALCIUM 9.3   Recent Labs  Lab 09/15/18 0536  AST 17  ALT 7  ALKPHOS 58      Recent Labs  Lab 09/15/18 0536 09/15/18 1002  WBC 9.4  --   HGB 8.2* 7.0*  HCT 26.1* 22.6*  PLT 135*  --    Recent Labs  Lab 09/15/18 0536  INR 1.1   Imaging Studies  April 2019 CT abdomen pelvis without contrast IMPRESSION: 1. Moderate to large stool ball within the rectum and distal sigmoid colon.  2. Cholelithiasis.  3. Atrophic kidneys. Left sided renal hypodense lesions, some characterize as cysts, some not characterized by this exam. Suggest a 3-6 month follow-up renal ultrasound. 4. Extensive calcific plaque throughout the abdominal vasculature. 5. No definite acute abnormality given the limitation of motion.  December 2018 CT abdomen pelvis without contrast IMPRESSION:  1. Kidneys are atrophic consistent with chronic renal failure.  2.  No acute abnormality   GI Procedures and Studies  No relevant studies to review   Assessment  [name] is a 73 y.o. female  with a pmh significant for end-stage renal disease on HD, lupus, history of CVA on Plavix/aspirin status post PPM.  The GI service is consulted for evaluation and management of anemia in the setting of hematochezia.  The etiology of the patient's hematochezia is not clear.  However there is no doubt that she has dropped her hemoglobin below her baseline and will require transfusion which I am understanding will be given during dialysis.  The patient remains hemodynamically stable at this point in time.  If the patient has hemodynamically unstable rectal bleeding occur is either transfusion dependent or progressive hypotension she may require urgent radiology tagged RBC versus CT angiography (giving IV contrast in the setting of already having end-stage renal disease does not complicate issues) if it were to need to be done urgently overnight.  I spoke with the patient and she is not sure that she would be able to even prepare after HD today and so we will plan to reevaluate on Friday and see if she would like  [place] to pursue an inpatient versus outpatient colonoscopy.  Her Plavix will still be on board even if we wait to prepare her for a procedure on Saturday.  She defers management options to her doctors and will go along with what ever we feel is best for her.  I am going to tentatively hold for spot on Friday versus Saturday but if she stops bleeding then it may not be unreasonable to consider a urgent outpatient evaluation though her hemodialysis needs and because of the COVID-19 pandemic it may make this more difficult.  It seems like this is likely a diverticular bleed however she is never had diverticulosis on any cross-sectional imaging be it not available for [place] to review formally but not in the reports.  She is obviously at risk for having rectal bleeding from polyps or malignant lesions.  Angiectasia's may also bleed as well.  Internal/external hemorrhoids could be playing a role and unfortunately because she was getting active HD I could not evaluate her rectum for hemorrhoidal disease pattern.  We will monitor her closely and see her blood counts into tomorrow.   Plan/Recommendations  Trend hemoglobin/hematocrit with hemoglobin goal greater than 7 We will consider inpatient versus outpatient colonoscopy and thus will hold inpatient preparation for now unless something urgently changes If patient has hemodynamically unstable GI bleeding with bright red blood per rectum please alert the GI service as the patient will likely need to have a cross-sectional CT angiography versus tagged RBC to evaluate the potential etiology/source of bleeding Clear liquid diet today   Thank you for this consult.  Dr. Scarlette Shorts takes over the service on 4/10.  GI will continue to follow.  Please page/call with questions or concerns.   Justice Britain, MD [facility] Advanced [facility]oscopy Office # [phone]

## 2018-09-15 NOTE — ED Triage Notes (Signed)
Per EMS patient from [facility] in [place].  States that she had a large bowel movement that consisted of blood clots.  Per nursing facility, patient lost 532mL of blood.  Alert and oriented x 4.    Dialysis patient with hemodialysis catheter to left chest, treatment on T/T/S

## 2018-09-15 NOTE — ED Notes (Signed)
ED TO INPATIENT HANDOFF REPORT  ED Nurse Name and Phone #: Islay Polanco [phone]  S Name/Age/Gender Abigail Buck 73 y.o. female Room/Bed: 020C/020C  Code Status   Code Status: Prior  Home/SNF/Other Nursing Home Patient oriented to: self, place and time Is this baseline? Yes   Triage Complete: Triage complete  Chief Complaint bleeding  Triage Note Per EMS patient from [facility] in [place].  States that she had a large bowel movement that consisted of blood clots.  Per nursing facility, patient lost 569mL of blood.  Alert and oriented x 4.    Dialysis patient with hemodialysis catheter to left chest, treatment on T/T/S   Allergies Allergies  Allergen Reactions  . Morphine Other (See Comments)    Opposite effect makes her agressive  . Clindamycin/Lincomycin     Listed on MAR    Level of Care/Admitting Diagnosis ED Disposition    ED Disposition Condition [facility] Area: [place] [100100]  Level of Care: Telemetry Medical [104]  Diagnosis: Acute GI bleeding [494496]  Admitting Physician: Aldine Contes [[id number]]  Attending Physician: Aldine Contes [phone]  PT Class (Do Not Modify): Observation [104]  PT Acc Code (Do Not Modify): Observation [10022]       B Medical/Surgery History Past Medical History:  Diagnosis Date  . Chronic kidney disease   . Lupus [facility])    Past Surgical History:  Procedure Laterality Date  . ABDOMINAL AORTOGRAM W/LOWER EXTREMITY Bilateral 08/08/2018   Procedure: ABDOMINAL AORTOGRAM W/LOWER EXTREMITY;  Surgeon: [name], MD;  Location: [facility];  Service: Cardiovascular;  Laterality: Bilateral;  . ABDOMINAL HYSTERECTOMY  1986  . AMPUTATION Right 08/09/2018   Procedure: AMPUTATION RIGHT GREAT TOE;  Surgeon: Angelia Mould, MD;  Location: [facility];  Service: Vascular;  Laterality: Right;  . PACEMAKER IMPLANT  10/08/2017   Done at [facility]     A IV  Location/Drains/Wounds Patient Lines/Drains/Airways Status   Active Line/Drains/Airways    Name:   Placement date:   Placement time:   Site:   Days:   Peripheral IV 09/15/18 Right   09/15/18    0529    -   less than 1   Hemodialysis Catheter Left Subclavian   08/08/18    1400    Subclavian   38   Incision (Closed) 08/09/18 Leg Right   08/09/18    1405     37   Wound / Incision (Open or Dehisced) 08/08/18 Other (Comment) Toe (Comment  which one) Anterior;Left dry   08/08/18    1445    Toe (Comment  which one)   38          Intake/Output Last 24 hours  Intake/Output Summary (Last 24 hours) at 09/15/2018 1154 Last data filed at 09/15/2018 0951 Gross per 24 hour  Intake 500 ml  Output -  Net 500 ml    Labs/Imaging Results for orders placed or performed during the hospital encounter of 09/15/18 (from the past 48 hour(s))  Type and screen [place]     Status: None (Preliminary result)   Collection Time: 09/15/18  4:52 AM  Result Value Ref Range   ABO/RH(D) O NEG    Antibody Screen NEG    Sample Expiration 09/18/2018    Unit Number [id number]    Blood Component Type RBC LR PHER2    Unit division 00    Status of Unit ALLOCATED    Transfusion Status OK  TO TRANSFUSE    Crossmatch Result      Compatible Performed at [facility], [place] [address], [place], [place] [address]   Comprehensive metabolic panel     Status: Abnormal   Collection Time: 09/15/18  5:36 AM  Result Value Ref Range   Sodium 137 135 - 145 mmol/L   Potassium 4.2 3.5 - 5.1 mmol/L   Chloride 96 (L) 98 - 111 mmol/L   CO2 26 22 - 32 mmol/L   Glucose, Bld 113 (H) 70 - 99 mg/dL   BUN 35 (H) 8 - 23 mg/dL   Creatinine, Ser 8.38 (H) 0.44 - 1.00 mg/dL   Calcium 9.3 8.9 - 10.3 mg/dL   Total Protein 6.3 (L) 6.5 - 8.1 g/dL   Albumin 2.9 (L) 3.5 - 5.0 g/dL   AST 17 15 - 41 U/L   ALT 7 0 - 44 U/L   Alkaline Phosphatase 58 38 - 126 U/L   Total Bilirubin 0.5 0.3 - 1.2 mg/dL   GFR calc non Af Amer  4 (L) >60 mL/min   GFR calc Af Amer 5 (L) >60 mL/min   Anion gap 15 5 - 15    Comment: Performed at [facility], [place] [address] [place], [place] [address]  CBC WITH DIFFERENTIAL     Status: Abnormal   Collection Time: 09/15/18  5:36 AM  Result Value Ref Range   WBC 9.4 4.0 - 10.5 K/uL   RBC 2.86 (L) 3.87 - 5.11 MIL/uL   Hemoglobin 8.2 (L) 12.0 - 15.0 g/dL   HCT 26.1 (L) 36.0 - 46.0 %   MCV 91.3 80.0 - 100.0 fL   MCH 28.7 26.0 - 34.0 pg   MCHC 31.4 30.0 - 36.0 g/dL   RDW 13.6 11.5 - 15.5 %   Platelets 135 (L) 150 - 400 K/uL   nRBC 0.0 0.0 - 0.2 %   Neutrophils Relative % 80 %   Neutro Abs 7.5 1.7 - 7.7 K/uL   Lymphocytes Relative 13 %   Lymphs Abs 1.2 0.7 - 4.0 K/uL   Monocytes Relative 6 %   Monocytes Absolute 0.5 0.1 - 1.0 K/uL   Eosinophils Relative 1 %   Eosinophils Absolute 0.1 0.0 - 0.5 K/uL   Basophils Relative 0 %   Basophils Absolute 0.0 0.0 - 0.1 K/uL   Immature Granulocytes 0 %   Abs Immature Granulocytes 0.04 0.00 - 0.07 K/uL    Comment: Performed at [facility], [address] [place], [place] [address]  Protime-INR     Status: None   Collection Time: 09/15/18  5:36 AM  Result Value Ref Range   Prothrombin Time 13.7 11.4 - 15.2 seconds   INR 1.1 0.8 - 1.2    Comment: (NOTE) INR goal varies based on device and disease states. Performed at [facility], [place] [address] [place], [place] [address]   Hemoglobin and hematocrit, blood     Status: Abnormal   Collection Time: 09/15/18 10:02 AM  Result Value Ref Range   Hemoglobin 7.0 (L) 12.0 - 15.0 g/dL   HCT 22.6 (L) 36.0 - 46.0 %    Comment: Performed at [facility], [place] [address], [place],  [address]  Prepare RBC     Status: None   Collection Time: 09/15/18 11:34 AM  Result Value Ref Range   Order Confirmation      ORDER PROCESSED BY BLOOD BANK Performed at [facility],  [address], [place], [place] [address]    No results found.  Pending Labs Unresulted  Labs (From admission, onward)   None      Vitals/Pain Today's Vitals   09/15/18 1015 09/15/18 1030 09/15/18 1115 09/15/18 1124  BP: (!) 123/53 (!) 109/42 (!) 115/57   Pulse: 81 80 73   Resp: 19 13 14    Temp:      TempSrc:      SpO2: 100% 100% 100%   Weight:      Height:      PainSc:    0-No pain    Isolation Precautions No active isolations  Medications Medications  0.9 %  sodium chloride infusion (Manually program via Guardrails IV Fluids) (has no administration in time range)  sodium chloride 0.9 % bolus 500 mL (0 mLs Intravenous Stopped 09/15/18 0951)    Mobility non-ambulatory Low fall risk   Focused Assessments Gastrointestinal   R Recommendations: See Admitting Provider Note  Report given to:   Additional Notes:

## 2018-09-15 NOTE — ED Provider Notes (Signed)
Signout from Dr Leonette Monarch. 73 year old female with history of end-stage renal disease due for dialysis today here after an episode of rectal bleeding overnight at her facility.  She has not had any further bleeding here and is at her baseline for hemoglobin. Physical Exam  BP (!) 112/30   Pulse 74   Temp 98.5 F (36.9 C) (Oral)   Resp (!) 25   Ht 5\' 3"  (1.6 m)   Wt 80 kg   SpO2 100%   BMI 31.24 kg/m   Physical Exam  ED Course/Procedures   Clinical Course as of Sep 14 1833  Thu Sep 15, 2018  1034 Patient's hemoglobin is dropped down to 7.  She has not had any further bowel movements.  It seems reasonable that the patient would be admitted to transfuse and make sure she does not have any ongoing bleeding.  I contacted the patient's daughter and updated with the results and the plan and they are in agreement.   [MB]  1109 Discussed with Internal medicine physician who will evaluate patient in the emergency department.    [MB]    Clinical Course User Index [MB] Hayden Rasmussen, MD    Procedures  MDM  Plan is to follow-up on repeat hemoglobin at 10 AM and if stable can return to her facility.  If hemoglobin dropping or further bleeding will need to be admitted to the hospital.      Hayden Rasmussen, MD 09/15/18 [phone]

## 2018-09-15 NOTE — ED Notes (Signed)
Pt's dtr updated on pt's status by MD.

## 2018-09-16 DIAGNOSIS — I4891 Unspecified atrial fibrillation: Secondary | ICD-10-CM | POA: Diagnosis present

## 2018-09-16 DIAGNOSIS — D62 Acute posthemorrhagic anemia: Secondary | ICD-10-CM | POA: Diagnosis present

## 2018-09-16 DIAGNOSIS — K921 Melena: Secondary | ICD-10-CM

## 2018-09-16 DIAGNOSIS — I2510 Atherosclerotic heart disease of native coronary artery without angina pectoris: Secondary | ICD-10-CM | POA: Diagnosis present

## 2018-09-16 DIAGNOSIS — Z89411 Acquired absence of right great toe: Secondary | ICD-10-CM | POA: Diagnosis not present

## 2018-09-16 DIAGNOSIS — K8020 Calculus of gallbladder without cholecystitis without obstruction: Secondary | ICD-10-CM | POA: Diagnosis present

## 2018-09-16 DIAGNOSIS — K648 Other hemorrhoids: Secondary | ICD-10-CM | POA: Diagnosis present

## 2018-09-16 DIAGNOSIS — D638 Anemia in other chronic diseases classified elsewhere: Secondary | ICD-10-CM | POA: Diagnosis present

## 2018-09-16 DIAGNOSIS — I120 Hypertensive chronic kidney disease with stage 5 chronic kidney disease or end stage renal disease: Secondary | ICD-10-CM | POA: Diagnosis present

## 2018-09-16 DIAGNOSIS — R350 Frequency of micturition: Secondary | ICD-10-CM | POA: Diagnosis present

## 2018-09-16 DIAGNOSIS — Z7902 Long term (current) use of antithrombotics/antiplatelets: Secondary | ICD-10-CM | POA: Diagnosis not present

## 2018-09-16 DIAGNOSIS — M329 Systemic lupus erythematosus, unspecified: Secondary | ICD-10-CM | POA: Diagnosis present

## 2018-09-16 DIAGNOSIS — Z66 Do not resuscitate: Secondary | ICD-10-CM | POA: Diagnosis present

## 2018-09-16 DIAGNOSIS — D696 Thrombocytopenia, unspecified: Secondary | ICD-10-CM | POA: Diagnosis present

## 2018-09-16 DIAGNOSIS — I472 Ventricular tachycardia: Secondary | ICD-10-CM | POA: Diagnosis present

## 2018-09-16 DIAGNOSIS — K922 Gastrointestinal hemorrhage, unspecified: Secondary | ICD-10-CM

## 2018-09-16 DIAGNOSIS — I69341 Monoplegia of lower limb following cerebral infarction affecting right dominant side: Secondary | ICD-10-CM | POA: Diagnosis not present

## 2018-09-16 DIAGNOSIS — I959 Hypotension, unspecified: Secondary | ICD-10-CM | POA: Diagnosis present

## 2018-09-16 DIAGNOSIS — I69322 Dysarthria following cerebral infarction: Secondary | ICD-10-CM

## 2018-09-16 DIAGNOSIS — N2581 Secondary hyperparathyroidism of renal origin: Secondary | ICD-10-CM | POA: Diagnosis present

## 2018-09-16 DIAGNOSIS — K644 Residual hemorrhoidal skin tags: Secondary | ICD-10-CM | POA: Diagnosis present

## 2018-09-16 DIAGNOSIS — I739 Peripheral vascular disease, unspecified: Secondary | ICD-10-CM | POA: Diagnosis present

## 2018-09-16 DIAGNOSIS — N186 End stage renal disease: Secondary | ICD-10-CM | POA: Diagnosis present

## 2018-09-16 DIAGNOSIS — K5731 Diverticulosis of large intestine without perforation or abscess with bleeding: Secondary | ICD-10-CM | POA: Diagnosis present

## 2018-09-16 DIAGNOSIS — K5730 Diverticulosis of large intestine without perforation or abscess without bleeding: Secondary | ICD-10-CM | POA: Diagnosis not present

## 2018-09-16 DIAGNOSIS — I69354 Hemiplegia and hemiparesis following cerebral infarction affecting left non-dominant side: Secondary | ICD-10-CM | POA: Diagnosis not present

## 2018-09-16 DIAGNOSIS — M3219 Other organ or system involvement in systemic lupus erythematosus: Secondary | ICD-10-CM | POA: Diagnosis present

## 2018-09-16 DIAGNOSIS — F0390 Unspecified dementia without behavioral disturbance: Secondary | ICD-10-CM | POA: Diagnosis present

## 2018-09-16 LAB — CBC
HCT: 26.3 % — ABNORMAL LOW (ref 36.0–46.0)
Hemoglobin: 8.7 g/dL — ABNORMAL LOW (ref 12.0–15.0)
MCH: 28.9 pg (ref 26.0–34.0)
MCHC: 33.1 g/dL (ref 30.0–36.0)
MCV: 87.4 fL (ref 80.0–100.0)
Platelets: 111 10*3/uL — ABNORMAL LOW (ref 150–400)
RBC: 3.01 MIL/uL — ABNORMAL LOW (ref 3.87–5.11)
RDW: 14.6 % (ref 11.5–15.5)
WBC: 9.6 10*3/uL (ref 4.0–10.5)
nRBC: 0 % (ref 0.0–0.2)

## 2018-09-16 LAB — MRSA PCR SCREENING: MRSA by PCR: NEGATIVE

## 2018-09-16 MED ORDER — DOXERCALCIFEROL 4 MCG/2ML IV SOLN
6.0000 ug | INTRAVENOUS | Status: DC
  Administered 2018-09-17: 6 ug via INTRAVENOUS
  Filled 2018-09-16: qty 4

## 2018-09-16 MED ORDER — CLOPIDOGREL BISULFATE 75 MG PO TABS
75.0000 mg | ORAL_TABLET | Freq: Every day | ORAL | Status: DC
  Administered 2018-09-16 – 2018-09-19 (×4): 75 mg via ORAL
  Filled 2018-09-16 (×4): qty 1

## 2018-09-16 NOTE — Progress Notes (Addendum)
Daily Rounding Note  09/16/2018, 11:42 AM  LOS: 0 days   SUBJECTIVE:   Chief complaint: Painless hematochezia.  Could be heard from the hallway talking/yelling. I asked her who she was talking to, she said "I'm talking to myself". Denies abdominal pain.  She is hungry. Has not had any further bleeding or BMs.  OBJECTIVE:         Vital signs in last 24 hours:    Temp:  [97.7 F (36.5 C)-99.7 F (37.6 C)] 99.7 F (37.6 C) (04/10 0446) Pulse Rate:  [56-113] 92 (04/10 0446) Resp:  [15-17] 16 (04/09 1710) BP: (89-142)/(44-88) 95/58 (04/10 0446) SpO2:  [98 %-100 %] 98 % (04/10 0446) Weight:  [75.8 kg-77.2 kg] 75.8 kg (04/09 1710) Last BM Date: 09/15/18 Filed Weights   09/15/18 1200 09/15/18 1336 09/15/18 1710  Weight: 76.9 kg 77.2 kg 75.8 kg   General: Non-ill-appearing elderly BF.  Stable, laying in bed. Heart: RRR. Chest: No labored breathing or cough.  Open mouth breathing. Abdomen: Soft.  Not tender, not distended.  Bowel sounds present but hypoactive.  No masses, HSM. Extremities: Contractures in the right leg.  No CCE.  AVG/fistula on left UE.   Neuro/Psych: She is able to tell me the date of her birth.  She responds appropriately.  She follows commands.  Intake/Output from previous day: 04/09 0701 - 04/10 0700 In: 816 [Blood:316; IV Piggyback:500] Out: 1500   Intake/Output this shift: No intake/output data recorded.  Lab Results: Recent Labs    09/15/18 0536 09/15/18 1002 09/15/18 1925 09/16/18 0747  WBC 9.4  --   --  9.6  HGB 8.2* 7.0* 9.8* 8.7*  HCT 26.1* 22.6* 30.8* 26.3*  PLT 135*  --   --  111*   BMET Recent Labs    09/15/18 0536  NA 137  K 4.2  CL 96*  CO2 26  GLUCOSE 113*  BUN 35*  CREATININE 8.38*  CALCIUM 9.3   LFT Recent Labs    09/15/18 0536  PROT 6.3*  ALBUMIN 2.9*  AST 17  ALT 7  ALKPHOS 58  BILITOT 0.5   PT/INR Recent Labs    09/15/18 0536  LABPROT 13.7   INR 1.1   Hepatitis Panel No results for input(s): HEPBSAG, HCVAB, HEPAIGM, HEPBIGM in the last 72 hours.  Studies/Results: Dg Knee 1-2 Views Right  Result Date: 09/15/2018 CLINICAL DATA:  Lateral right knee pain and limited range of motion. No known injury. EXAM: RIGHT KNEE - 1-2 VIEW COMPARISON:  None. FINDINGS: No evidence of fracture, dislocation, or joint effusion. No evidence of arthropathy or other focal bone abnormality. Atherosclerosis noted. Soft tissues are otherwise unremarkable. IMPRESSION: Normal right knee. Atherosclerosis. Electronically Signed   By: Inge Rise M.D.   On: 09/15/2018 13:49   Scheduled Meds: . chlorhexidine  15 mL Mouth Rinse BID  . Chlorhexidine Gluconate Cloth  6 each Topical Q0600  . clopidogrel  75 mg Oral Daily  . darbepoetin (ARANESP) injection - DIALYSIS  60 mcg Intravenous Q Thu-HD  . [START ON 09/17/2018] doxercalciferol  6 mcg Intravenous Q T,Th,Sa-HD  . hydroxychloroquine  200 mg Oral Q1200  . mouth rinse  15 mL Mouth Rinse q12n4p  . [START ON 09/17/2018] midodrine  10 mg Oral Q T,Th,Sa-HD  . sevelamer carbonate  800 mg Oral TID WC   Continuous Infusions: . sodium chloride    . sodium chloride     PRN Meds:.sodium chloride, sodium chloride, acetaminophen **  OR** acetaminophen, alteplase, heparin, lidocaine (PF), lidocaine-prilocaine, oxyCODONE, pentafluoroprop-tetrafluoroeth   ASSESMENT:   *    Painless hematochezia.   Suspect diverticular bleed. No recall of previous colon/egd.    *   Anemia.  On weekly aranesp.  Hgb 7 >> 1 U PRBC >> 9.8b >> 8.7 since admission. 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   Only ASA on hold.     *   Thrombocytopenia.    *   Dementia.     PLAN   *   Dr Henrene Pastor assuming GI coverage and will decide re colonoscopy while inpt.  Please see his comments as  outlined below.  However given ongoing Plavix this would be diagnostic only.    *   Renal and soft diet.    Azucena Freed  09/16/2018, 11:42 AM Phone [phone]  GI ATTENDING  Case discussed with Dr. Rush Landmark.  Interval history and data reviewed.  Agree with interval progress note as outlined above.  The patient's bleeding has ceased.  She is stable.  Multiple medical problems as noted.  Continuing on Plavix at this time which is okay as she has stopped bleeding and has a prior history of stroke.  I think that it would be best to proceed with colonoscopy as an inpatient prior to discharge.  We will try to arrange this over the weekend, possibly Sunday.  We will see her tomorrow for a definitive answer.  If, however, she were to develop recurrent significant acute bleeding in the interim, then proceed to CT angiogram for identification of bleeding site and possible IR therapy.  Please transfuse to a clinically appropriate hemoglobin level.  Docia Chuck. Geri Seminole., M.D. [facility] Division of Gastroenterology

## 2018-09-16 NOTE — Progress Notes (Signed)
Subjective: HD#0   Overnight: Transfused 1U PRBC, underwent HD  Today, Ms. Chiappetta was examined at bedside.  Initially she was upset stated "the nurse does not know what she is doing. "After much convincing, she was able to calm down and stated that she is doing well this morning and has not had any subsequent episodes of bright red blood per rectum, which was also confirmed by the nurse at bedside.  She denies lightheadedness, dizziness, palpitation, shortness of breath.  She had refused labs this morning but later on agreed.  Objective:  Vital signs in last 24 hours: Vitals:   09/15/18 1710 09/15/18 2100 09/15/18 2118 09/16/18 0446  BP: (!) 109/48  104/67 (!) 95/58  Pulse: 96  100 92  Resp: 16     Temp: 97.8 F (36.6 C)  99.3 F (37.4 C) 99.7 F (37.6 C)  TempSrc: Oral  Oral Oral  SpO2: 98% 100% 100% 98%  Weight: 75.8 kg     Height:       Const: Lying comfortably in bed, in no apparent distress, speaks in full sentences Resp: Clear to auscultation bilaterally CV: RRR, no murmurs, gallops, rubs Abd: Bowel sounds present, nontender to palpation Ext: No edema, right lower extremity in a flexed position Skin: No skin pallor, old scar at the left upper extremity.  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  #Acute lower GI bleed: Initially there was conflicting story regarding the source of her bleeding as she had reported to the nurse that the bleeding was potentially vaginal.   However on further evaluation, she clarified that it was indeed bright red blood per rectum.  And also on genital-urinary exams, there were no obvious bleeding that was appreciated.  On presentation, she was noted to have a drop in hemoglobin from 8.2-7.1 and overnight, she was transfused 1 unit of PRBC and hemoglobin improved to 9.8 from 7.1.  She was evaluated by gastroenterology who recommended tagged PRBC scan versus cross-sectional CT angiography if patient's acute GI bleed persisted or became hemodynamically unstable.  Otherwise, they recommended to follow-up with routine labs and consider possible outpatient colonoscopy if she is stable.  During evaluation by gastroenterology, patient stated that she would not be able to undergo bowel prep given the timeline however GI preemptively reserved an endoscopy spot for her either Friday or Saturday.  Differential diagnosis for her acute lower GI bleed include internal hemorrhoids versus anal fissure versus diverticulosis (though her prior CT scans has not showed any evidence of diverticulosis) versus angiodysplasia versus AV malformation versus malignancy. - Appreciate gastroenterology recommendation -Since her hemoglobin is stable and no further BRBPR, I presume she can follow-up outpatient for colonoscopy. -Transfuse if hemoglobin <7 - Follow-up CBC  #ESRD on HD TTS: She is compliant with hemodialysis sessions and underwent HD yesterday with 1.5 L output.  She receives midodrine pre-hemodialysis due to complication of hypotension - Continue to monitor - Continue inpatient HD if she remains hospitalized  #Systemic lupus erythematosus: -Continue Plaquenil  #History of CVA: Patient suffered from cerebrovascular  accident residual left-sided weakness and dysarthria.  Though unsure when exactly the incident occurred as there is no records in our system.  She has since been on dual antiplatelet therapy with aspirin and Plavix.  In an ideal situation for an  acute GI bleed, her antiplatelet therapy would have been held however she has remained hemodynamically stable and GI has given the okay to continue Plavix - Continue Plavix per GI  #Hypertension: BP has been labile with range 90s- 130s/40s-80s -Hold Coreg  #History of paroxysmal tachycardia #History of complete heart block On presentation to the ED, she was initially tachycardic to the 110s most likely in the setting of acute GI bleed however this resolved with fluids resuscitation and blood transfusion. - Status post pacemaker placement - Continue to monitor  FEN: Replace electrolytes as needed, n.p.o. given possible endoscopy VTE ppx: SCDs CODE STATUS: DNR  Dispo: Anticipated discharge to skilled nursing facility in approximately 1-2 day(s).   Jean Rosenthal, MD 09/16/2018, 6:13 AM Pager: [phone] IMTS PGY-1

## 2018-09-16 NOTE — Progress Notes (Addendum)
Abigail Buck Progress Note   Subjective: "Nothing hurting, Nothing going on". Pleasant, NAD. Watching TV. No further bloody stools.   Objective Vitals:   09/15/18 1710 09/15/18 2100 09/15/18 2118 09/16/18 0446  BP: (!) 109/48  104/67 (!) 95/58  Pulse: 96  100 92  Resp: 16     Temp: 97.8 F (36.6 C)  99.3 F (37.4 C) 99.7 F (37.6 C)  TempSrc: Oral  Oral Oral  SpO2: 98% 100% 100% 98%  Weight: 75.8 kg     Height:       Physical Exam General: Elderly female in NAD Heart: S1,S2 RRR V pacing on monitor.  Lungs: CTAB A/P Abdomen: Active BS Extremities: No LE edema. S/P R great toe amp-suture line intact.  Dialysis Access: [facility] CDI.   Additional Objective Labs: Basic Metabolic Panel: Recent Labs  Lab 09/15/18 0536  NA 137  K 4.2  CL 96*  CO2 26  GLUCOSE 113*  BUN 35*  CREATININE 8.38*  CALCIUM 9.3   Liver Function Tests: Recent Labs  Lab 09/15/18 0536  AST 17  ALT 7  ALKPHOS 58  BILITOT 0.5  PROT 6.3*  ALBUMIN 2.9*   No results for input(s): LIPASE, AMYLASE in the last 168 hours. CBC: Recent Labs  Lab 09/15/18 0536 09/15/18 1002 09/15/18 1925 09/16/18 0747  WBC 9.4  --   --  9.6  NEUTROABS 7.5  --   --   --   HGB 8.2* 7.0* 9.8* 8.7*  HCT 26.1* 22.6* 30.8* 26.3*  MCV 91.3  --   --  87.4  PLT 135*  --   --  111*   Blood Culture No results found for: SDES, SPECREQUEST, CULT, REPTSTATUS  Cardiac Enzymes: No results for input(s): CKTOTAL, CKMB, CKMBINDEX, TROPONINI in the last 168 hours. CBG: No results for input(s): GLUCAP in the last 168 hours. Iron Studies: No results for input(s): IRON, TIBC, TRANSFERRIN, FERRITIN in the last 72 hours. @lablastinr3 @ Studies/Results: Dg Knee 1-2 Views Right  Result Date: 09/15/2018 CLINICAL DATA:  Lateral right knee pain and limited range of motion. No known injury. EXAM: RIGHT KNEE - 1-2 VIEW COMPARISON:  None. FINDINGS: No evidence of fracture, dislocation, or joint effusion. No evidence  of arthropathy or other focal bone abnormality. Atherosclerosis noted. Soft tissues are otherwise unremarkable. IMPRESSION: Normal right knee. Atherosclerosis. Electronically Signed   By: Abigail Buck M.D.   On: 09/15/2018 13:49   Medications: . sodium chloride    . sodium chloride     . chlorhexidine  15 mL Mouth Rinse BID  . Chlorhexidine Gluconate Cloth  6 each Topical Q0600  . clopidogrel  75 mg Oral Daily  . darbepoetin (ARANESP) injection - DIALYSIS  60 mcg Intravenous Q Thu-HD  . hydroxychloroquine  200 mg Oral Q1200  . mouth rinse  15 mL Mouth Rinse q12n4p  . [START ON 09/17/2018] midodrine  10 mg Oral Q T,Th,Sa-HD  . sevelamer carbonate  800 mg Oral TID WC     Dialysis Orders: [place] T,Th,S 3 hr 15 min 180NRe 400/800 79.8 kg. 2.0 K/ 2.25 Ca LIJ TDC (needs site corrected in ecube) -Heparin 4000 units IV initial bolus Heparin 1000 units IV mid run TIW -Mircera 60 mcg IV q 4 week (last dose 08/18/2018-due today) -Venofer 50 mg IV weekly (Start today) -Parsabiv 5 mg IV TIW -Hectorol 6 mcg IV TIW  Assessment/Plan: 1.  BRBPR- No further bloody stools since adm. HGB 7.0. Transfused 1 unit of PRBCs, HGB  now 8.7. Pt on plavix. GI consulted-recommended OP follow up since no further bleeding.  2.  ESRD -  T,Th,S via TDC. No heparin. HD tomorrow on schedule. DC heparin on discharge.  3.  Hypertension/volume  -HD 04/09 pre wt 77.2 kg Net UF 1.5 Post wt 75.8. Has been leaving under EDW at OP clinic. Lower EDW on DC. On midodrine 10 mg PO T,Th,S prior to HD. Low dose carvedilol.  4.  Anemia  - As noted above. Rec'd 1 unit PRBCs, Aranesp 60 mcg IV and NuLecit 62.5 mg with HD 09/15/18.  Follow HGB 5.  Metabolic bone disease -Ca 9.3 Recent OP phos at goal. Continue renvela binders, VDRA. Parsabiv not on hospital formulary.  6.  Nutrition - Albumin 2.9 Renal diet with fld restrictions, prostat, renal vits. 7.  SLE-per primary. On Plaquenil.  8.  PAD-S/P R toe amp. Per primary.   9. H/O CHB-has PPM.   Abigail Rodriquez H. Abigail Harnish NP-C 09/16/2018, 10:45 AM  [employer] [phone]

## 2018-09-16 NOTE — Evaluation (Signed)
Physical Therapy Evaluation Patient Details Name: Abigail Buck MRN: [id number] DOB: 1945-09-19 Today's Date: 09/16/2018   History of Present Illness  Pt is a 73 y/o female admitted secondary to painless hematochezia, suspected diverticular bleed. 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    Clinical Impression  Pt presented supine in bed with HOB elevated, awake and willing to participate in therapy session. Prior to admission, pt reported that she uses a w/c for mobility, can perform transfers independently and bathe/dress herself. Pt with dementia at baseline and a poor historian. At time of evaluation, pt required max A x2 for bed mobility. Pt would continue to benefit from skilled physical therapy services at this time while admitted and after d/c to address the below listed limitations in order to improve overall safety and independence with functional mobility.     Follow Up Recommendations SNF    Equipment Recommendations  None recommended by PT    Recommendations for Other Services       Precautions / Restrictions Precautions Precautions: Fall Restrictions Weight Bearing Restrictions: No      Mobility  Bed Mobility Overal bed mobility: Needs Assistance Bed Mobility: Rolling Rolling: Max assist;+2 for physical assistance         General bed mobility comments: pt able to use R UE on bed rail to assist with roll towards her L side; heavy physical assistance of two and use of bed pads  Transfers                 General transfer comment: would require mechanical lift  Ambulation/Gait                Stairs            Wheelchair Mobility    Modified Rankin (Stroke Patients Only)       Balance                                             Pertinent Vitals/Pain Pain  Assessment: No/denies pain    Home Living Family/patient expects to be discharged to:: Skilled nursing facility                      Prior Function Level of Independence: Needs assistance   Gait / Transfers Assistance Needed: pt reported that she uses a w/c for mobility  ADL's / Homemaking Assistance Needed: stated that she can perform herself  Comments: pt with cognitive deficits throughout and unsure of reliability of the information provided     Hand Dominance        Extremity/Trunk Assessment   Upper Extremity Assessment Upper Extremity Assessment: Generalized weakness;LUE deficits/detail;Difficult to assess due to impaired cognition LUE Deficits / Details: pt with flexion contraction of L hand    Lower Extremity Assessment Lower Extremity Assessment: RLE deficits/detail;LLE deficits/detail;Difficult to assess due to impaired cognition RLE Deficits / Details: flexion contractures throughout; no active movement of either LE noted during eval LLE Deficits / Details: flexion contractures throughout; no active movement of either LE noted during eval       Communication   Communication: No difficulties  Cognition Arousal/Alertness: Awake/alert Behavior During Therapy: WFL for tasks assessed/performed Overall Cognitive Status: Impaired/Different from baseline Area of Impairment: Orientation;Attention;Memory;Following  commands;Safety/judgement;Awareness;Problem solving                 Orientation Level: Disoriented to;Time;Situation Current Attention Level: Sustained Memory: Decreased short-term memory;Decreased recall of precautions Following Commands: Follows one step commands with increased time;Follows one step commands inconsistently Safety/Judgement: Decreased awareness of deficits;Decreased awareness of safety Awareness: Intellectual Problem Solving: Difficulty sequencing;Requires verbal cues;Requires tactile cues        General Comments       Exercises     Assessment/Plan    PT Assessment Patient needs continued PT services  PT Problem List Decreased strength;Decreased activity tolerance;Decreased balance;Decreased mobility;Decreased coordination;Decreased cognition;Decreased knowledge of use of DME;Decreased safety awareness;Decreased knowledge of precautions       PT Treatment Interventions DME instruction;Functional mobility training;Therapeutic activities;Therapeutic exercise;Balance training;Neuromuscular re-education;Cognitive remediation;Patient/family education;Wheelchair mobility training    PT Goals (Current goals can be found in the Care Plan section)  Acute Rehab PT Goals Patient Stated Goal: unable to state PT Goal Formulation: Patient unable to participate in goal setting Time For Goal Achievement: 09/30/18 Potential to Achieve Goals: Fair    Frequency Min 2X/week   Barriers to discharge        Co-evaluation               AM-PAC PT "6 Clicks" Mobility  Outcome Measure Help needed turning from your back to your side while in a flat bed without using bedrails?: Total Help needed moving from lying on your back to sitting on the side of a flat bed without using bedrails?: Total Help needed moving to and from a bed to a chair (including a wheelchair)?: Total Help needed standing up from a chair using your arms (e.g., wheelchair or bedside chair)?: Total Help needed to walk in hospital room?: Total Help needed climbing 3-5 steps with a railing? : Total 6 Click Score: 6    End of Session   Activity Tolerance: Patient limited by fatigue Patient left: in bed;with call bell/phone within reach;with bed alarm set Nurse Communication: Mobility status;Need for lift equipment PT Visit Diagnosis: Other abnormalities of gait and mobility (R26.89);Muscle weakness (generalized) (M62.81)    Time: 0998-3382 PT Time Calculation (min) (ACUTE ONLY): 12 min   Charges:   PT Evaluation $PT Eval Moderate  Complexity: 1 Mod          Sherie Don, PT, DPT  Acute Rehabilitation Services Pager [phone] Office [place] 09/16/2018, 1:09 PM

## 2018-09-16 NOTE — Progress Notes (Signed)
Pt refused lab work this morning. Will continue to monitor pt. Ranelle Oyster, RN

## 2018-09-16 NOTE — Discharge Summary (Signed)
Name: Abigail Buck MRN: [id number] DOB: 1945/12/11 73 y.o. PCP: System, Pcp Not In  Date of Admission: 09/15/2018  4:23 AM Date of Discharge: 09/26/18 Attending Physician: Sid Falcon, MD  Discharge Diagnosis: 1.  Acute hematochezia; diverticulosis of entire colon 2.  ESRD on HD TTS 3.  Thrombocytopenia 4.  Systemic lupus erythematosus 4.  History of CVA 5.  History of hypertension; labile BP 6.  History of paroxysmal tachycardia; history of complete heart block  Discharge Medications: Allergies as of 09/19/2018      Reactions   Morphine Other (See Comments)   Opposite effect makes her agressive   Clindamycin/lincomycin    Listed on MAR      Medication List    STOP taking these medications   aspirin EC 81 MG tablet     TAKE these medications   acetaminophen 325 MG tablet Commonly known as:  TYLENOL Take 650 mg by mouth every 4 (four) hours as needed (general discomfort).   ALPRAZolam 0.25 MG tablet Commonly known as:  XANAX Take 0.25 mg by mouth 2 (two) times daily.   carvedilol 6.25 MG tablet Commonly known as:  COREG Take 6.25 mg by mouth 2 (two) times daily with a meal.   clopidogrel 75 MG tablet Commonly known as:  PLAVIX Take 75 mg by mouth daily.   ergocalciferol 1.25 MG (50000 UT) capsule Commonly known as:  VITAMIN D2 Take 50,000 Units by mouth every Friday.   hydroxychloroquine 200 MG tablet Commonly known as:  PLAQUENIL Take 200 mg by mouth daily at 12 noon.   midodrine 10 MG tablet Commonly known as:  PROAMATINE Take 10 mg by mouth daily at 12 noon.   oxyCODONE 5 MG immediate release tablet Commonly known as:  Oxy IR/ROXICODONE Take 1-2 tablets (5-10 mg total) by mouth every 4 (four) hours as needed for moderate pain.   sevelamer carbonate 800 MG tablet Commonly known as:  RENVELA Take 800 mg by mouth 3 (three) times daily with meals.       Disposition and follow-up:   Abigail Buck was discharged from [facility] in Good condition.  At the hospital follow up visit please address:  1.  Acute hematochezia; diverticulosis of entire colon: Repeat colonoscopy is not recommended for surveillance per GI. Received a total of 2 units during admission. Hgb at discharge 9.2.         ESRD on HD TTS; Thrombocytopenia: Discontinue heparin at discharge    2.  Labs / imaging needed at time of follow-up: CBC  3.  Pending labs/ test needing follow-up: None  Follow-up Appointments:   Hospital Course by problem list: 1.  Acute hematochezia; diverticulosis of entire colon:  Abigail Buck is a very pleasant 73 year old African-American woman with ESRD on hemodialysis (Tuesday, Thursday, Saturday), systemic lupus erythematosus, hypertension, paroxysmal tachycardia, complete heart block status post pacemaker placement, history of CVA (on aspirin and Plavix) with residual left sided weakness and dysarthria, dementia who presented to [facility] emergency department on September 15, 2018 with acute onset bright red blood per rectum. Initially there was conflicting story regarding the source of her bleeding as she had reported to the nurse that the bleeding was potentially vaginal.  However on further evaluation, she clarified that it was indeed bright red blood per rectum.  And also on genital-urinary exams, there were no obvious bleeding that was appreciated.  On presentation, she was noted to have a drop in hemoglobin from 8.2-7.1 and overnight, she was  transfused 1 unit of PRBC and hemoglobin improved to 9.8 from 7.1.  After blood transfusion, her hemoglobin had improved to 9.8 however subsequently decreased to 6.5 and was transfused another unit of PRBC.  She underwent colonoscopy on September 18, 2018 which revealed diverticulosis of the entire colon. From a GI standpoint, recommended continuing Plavix given CVA history. Aspirin was discontinued. She was monitored overnight after colonoscopy to ensure hgb remained stable. Hemoglobin  at discharge was 9.2.   2.  ESRD on HD TTS: Abigail Buck is compliant with hemodialysis sessions underwent hemodialysis per her usual schedule during her admission.  3.  Systemic lupus erythematosus: We continued Plaquenil  4.  History of CVA: Abigail Buck suffered from cerebrovascular accident residual left-sided weakness and dysarthria.  Though unsure when exactly the incident occurred as there is no records in our system. Aspirin was held given acute onset hematochezia however Plavix was continued per GI recommendations.   5.  Hypertension: Abigail Buck had a labile blood pressure with range 90s- 130s/40s-80s.  Given this Coreg was initially held. Resumed morning of discharge given her run of sustained asymptomatic Vtach overnight.   6.  History of paroxysmal tachycardia 7.  History of complete heart block Patient had run of sustained asymptomatic Vtach on 4/12. Her Coreg had been held due to hypotension on admission. This was resumed, and heart rates improved back into range of 60-80 on day of discharge.   8.  Thrombocytopenia: Abigail Buck was noted to have decrease in platelet from 135 on admission to 96.  Given this, heparin was held and platelet had improved to 145 at discharge.  Per nephrology, heparin held at discharge.   Discharge Vitals:   BP 140/82 (BP Location: Right Arm)    Pulse 88    Temp 98.4 F (36.9 C) (Oral)    Resp 20    Ht 5\' 6"  (1.676 m)    Wt 74.2 kg    SpO2 96%    BMI 26.40 kg/m   Pertinent Labs, Studies, and Procedures:  CBC Latest Ref Rng & Units 09/19/2018 09/18/2018 09/17/2018  WBC 4.0 - 10.5 K/uL 7.3 7.5 8.2  Hemoglobin 12.0 - 15.0 g/dL 9.2(L) 9.4(L) 9.2(L)  Hematocrit 36.0 - 46.0 % 28.6(L) 29.0(L) 28.6(L)  Platelets 150 - 400 K/uL 145(L) 122(L) 118(L)    Colonoscopy on September 18, 2018.  - Diverticulosis in the entire examined colon. - The examination was otherwise normal on direct and retroflexion views. - No no blood or active bleeding.  Impression: - Repeat  colonoscopy is not recommended for surveillance. -Okay to resume Plavix (clopidogrel) today at prior dose if medically  Discharge Instructions: Discharge Instructions    Call MD for:  difficulty breathing, headache or visual disturbances   Complete by:  As directed    Call MD for:  extreme fatigue   Complete by:  As directed    Call MD for:  persistant dizziness or light-headedness   Complete by:  As directed    Call MD for:  persistant nausea and vomiting   Complete by:  As directed    Call MD for:  severe uncontrolled pain   Complete by:  As directed    Call MD for:  temperature >100.4   Complete by:  As directed    Diet - low sodium heart healthy   Complete by:  As directed    Discharge instructions   Complete by:  As directed    Ms. Hanger, it was a pleasure taking care of  you. You were treated for a GI bleed due to diverticulosis throughout your colon which has now stopped on its own. We have stopped your aspirin, but you will continue taking Plavix every day like you've been taking to prevent future strokes.  Take care!   Increase activity slowly   Complete by:  As directed       Signed: Delice Bison, DO 09/19/2018, 10:52 AM

## 2018-09-17 ENCOUNTER — Encounter (HOSPITAL_COMMUNITY)
Admission: EM | Disposition: A | Discharge status: Skilled Nursing Facility | Payer: Self-pay | Source: Home / Self Care | Attending: Internal Medicine

## 2018-09-17 DIAGNOSIS — I959 Hypotension, unspecified: Secondary | ICD-10-CM

## 2018-09-17 DIAGNOSIS — D696 Thrombocytopenia, unspecified: Secondary | ICD-10-CM

## 2018-09-17 LAB — CBC
HCT: 21.9 % — ABNORMAL LOW (ref 36.0–46.0)
HCT: 20.7 % — ABNORMAL LOW (ref 36.0–46.0)
HCT: 28.6 % — ABNORMAL LOW (ref 36.0–46.0)
Hemoglobin: 7.2 g/dL — ABNORMAL LOW (ref 12.0–15.0)
Hemoglobin: 6.5 g/dL — CL (ref 12.0–15.0)
Hemoglobin: 9.2 g/dL — ABNORMAL LOW (ref 12.0–15.0)
MCH: 28.9 pg (ref 26.0–34.0)
MCH: 27.9 pg (ref 26.0–34.0)
MCH: 27.7 pg (ref 26.0–34.0)
MCHC: 32.9 g/dL (ref 30.0–36.0)
MCHC: 31.4 g/dL (ref 30.0–36.0)
MCHC: 32.2 g/dL (ref 30.0–36.0)
MCV: 88 fL (ref 80.0–100.0)
MCV: 88.8 fL (ref 80.0–100.0)
MCV: 86.1 fL (ref 80.0–100.0)
Platelets: 98 10*3/uL — ABNORMAL LOW (ref 150–400)
Platelets: 96 10*3/uL — ABNORMAL LOW (ref 150–400)
Platelets: 118 10*3/uL — ABNORMAL LOW (ref 150–400)
RBC: 2.49 MIL/uL — ABNORMAL LOW (ref 3.87–5.11)
RBC: 2.33 MIL/uL — ABNORMAL LOW (ref 3.87–5.11)
RBC: 3.32 MIL/uL — ABNORMAL LOW (ref 3.87–5.11)
RDW: 14.2 % (ref 11.5–15.5)
RDW: 14 % (ref 11.5–15.5)
RDW: 13.7 % (ref 11.5–15.5)
WBC: 7.3 10*3/uL (ref 4.0–10.5)
WBC: 6 10*3/uL (ref 4.0–10.5)
WBC: 8.2 10*3/uL (ref 4.0–10.5)
nRBC: 0 % (ref 0.0–0.2)
nRBC: 0 % (ref 0.0–0.2)
nRBC: 0 % (ref 0.0–0.2)

## 2018-09-17 LAB — RENAL FUNCTION PANEL
Albumin: 2.5 g/dL — ABNORMAL LOW (ref 3.5–5.0)
Anion gap: 15 (ref 5–15)
BUN: 28 mg/dL — ABNORMAL HIGH (ref 8–23)
CO2: 25 mmol/L (ref 22–32)
Calcium: 8.7 mg/dL — ABNORMAL LOW (ref 8.9–10.3)
Chloride: 95 mmol/L — ABNORMAL LOW (ref 98–111)
Creatinine, Ser: 6.39 mg/dL — ABNORMAL HIGH (ref 0.44–1.00)
GFR calc Af Amer: 7 mL/min — ABNORMAL LOW (ref >60)
GFR calc non Af Amer: 6 mL/min — ABNORMAL LOW (ref >60)
Glucose, Bld: 82 mg/dL (ref 70–99)
Phosphorus: 4.8 mg/dL — ABNORMAL HIGH (ref 2.5–4.6)
Potassium: 3.3 mmol/L — ABNORMAL LOW (ref 3.5–5.1)
Sodium: 135 mmol/L (ref 135–145)

## 2018-09-17 LAB — PREPARE RBC (CROSSMATCH)

## 2018-09-17 SURGERY — COLONOSCOPY
Anesthesia: Monitor Anesthesia Care

## 2018-09-17 MED ORDER — MIDODRINE HCL 5 MG PO TABS
ORAL_TABLET | ORAL | Status: AC
  Administered 2018-09-17: 10 mg via ORAL
  Filled 2018-09-17: qty 2

## 2018-09-17 MED ORDER — SODIUM CHLORIDE 0.9 % IV SOLN: 100.0000 mL | INTRAVENOUS | Status: DC | PRN

## 2018-09-17 MED ORDER — PEG-KCL-NACL-NASULF-NA ASC-C 100 G PO SOLR
0.5000 | Freq: Once | ORAL | Status: AC
  Administered 2018-09-17: 100 g via ORAL
  Filled 2018-09-17: qty 1

## 2018-09-17 MED ORDER — SODIUM CHLORIDE 0.9% IV SOLUTION: Freq: Once | INTRAVENOUS | Status: DC

## 2018-09-17 MED ORDER — PEG-KCL-NACL-NASULF-NA ASC-C 100 G PO SOLR: 1.0000 | Freq: Once | ORAL | Status: DC

## 2018-09-17 MED ORDER — BISACODYL 5 MG PO TBEC
5.0000 mg | DELAYED_RELEASE_TABLET | Freq: Once | ORAL | Status: AC
  Administered 2018-09-17: 5 mg via ORAL
  Filled 2018-09-17: qty 1

## 2018-09-17 MED ORDER — DOXERCALCIFEROL 4 MCG/2ML IV SOLN
INTRAVENOUS | Status: AC
  Administered 2018-09-17: 6 ug via INTRAVENOUS
  Filled 2018-09-17: qty 4

## 2018-09-17 MED ORDER — DARBEPOETIN ALFA 100 MCG/0.5ML IJ SOSY: 100.0000 ug | PREFILLED_SYRINGE | INTRAMUSCULAR | Status: DC

## 2018-09-17 MED ORDER — PEG-KCL-NACL-NASULF-NA ASC-C 100 G PO SOLR
0.5000 | Freq: Once | ORAL | Status: AC
  Administered 2018-09-18: 100 g via ORAL
  Filled 2018-09-17: qty 1

## 2018-09-17 MED ORDER — HEPARIN SODIUM (PORCINE) 1000 UNIT/ML IJ SOLN
INTRAMUSCULAR | Status: AC
  Filled 2018-09-17: qty 1

## 2018-09-17 NOTE — Progress Notes (Signed)
CRITICAL VALUE ALERT  Critical Value:  HgB 6.5  Date & Time Notied:  09/17/2018 0900  Provider Notified: PA Amalia Hailey   Orders Received/Actions taken: Administer 1 unit of blood

## 2018-09-17 NOTE — Progress Notes (Signed)
[facility] Progress Note   Dialysis Orders: [place] T,Th,S 3 hr 15 min 180NRe 400/800 79.8 kg. 2.0 K/ 2.25 Ca [facility] TDC (needs site corrected in ecube) -Heparin 4000 units IV initial bolus Heparin 1000 units IV mid run TIW -Mircera 60 mcg IV q 4 week (last dose 08/18/2018-due today) -Venofer 50 mg IV weekly (Start today) -Parsabiv 5 mg IV TIW -Hectorol 6 mcg IV TIW  Assessment/Plan: 1. BRBPR-painless hematochezia  Hgb continues to drift down. Have ordered another unit of PRBC to give on dialysis - likely will need more.Pt on plavix. GI consulted-recommended OP follow up since no further bleeding but hgb down again today 2. ESRD - T,Th,S via TDC. No heparin. DC heparin on discharge. K 3.3 - 4 K bath 3. Hypertension/volume -HD 04/09 pre wt 77.2 kg Net UF 1.5 Post wt 75.8. Has been leaving under EDW at OP clinic. Lower EDW on DC. On midodrine 10 mg PO T,Th,S prior to HD. Low dose carvedilol.  4. Anemia -hgb 6.5 today Aranesp 60 mcg IV and NuLecit 62.5 mg with HD 09/15/18.^ 100 next week 5. Metabolic bone disease -Ca/P ok. Continue renvela binders, VDRA.Parsabiv not on hospital formulary.  6. Nutrition - Albumin 2.5 Renal diet with fld restrictions, prostat, renal vits. 7. SLE-per primary. On Plaquenil. 8. PAD-S/P R toe amp. Per primary.  9. H/O CHB-has PPM.  10. Thrombocytopenia - plts declining- off heparin watch  Myriam Jacobson, PA-C [place] 09/17/2018,8:37 AM  LOS: 1 day   Subjective:   No c/o no pain.  Objective Vitals:   09/16/18 0446 09/16/18 1728 09/16/18 2149 09/17/18 0435  BP: (!) 95/58 121/62 (!) 105/40   Pulse: 92 84 88   Resp:  18 14   Temp: 99.7 F (37.6 C) 98.8 F (37.1 C) 98.2 F (36.8 C)   TempSrc: Oral     SpO2: 98%  99%   Weight:    78.7 kg  Height:       Physical Exam General: NAD supine on HD - HOH Heart: RRR Lungs: no rales anterirorly Abdomen: obese soft NT Extremities: tr LE  edema Dialysis Access: left IJ [facility]   Additional Objective Labs: Basic Metabolic Panel: Recent Labs  Lab 09/15/18 0536 09/17/18 0741  NA 137 135  K 4.2 3.3*  CL 96* 95*  CO2 26 25  GLUCOSE 113* 82  BUN 35* 28*  CREATININE 8.38* 6.39*  CALCIUM 9.3 8.7*  PHOS  --  4.8*   Liver Function Tests: Recent Labs  Lab 09/15/18 0536 09/17/18 0741  AST 17  --   ALT 7  --   ALKPHOS 58  --   BILITOT 0.5  --   PROT 6.3*  --   ALBUMIN 2.9* 2.5*   No results for input(s): LIPASE, AMYLASE in the last 168 hours. CBC: Recent Labs  Lab 09/15/18 0536  09/15/18 1925 09/16/18 0747 09/17/18 0258  WBC 9.4  --   --  9.6 7.3  NEUTROABS 7.5  --   --   --   --   HGB 8.2*   < > 9.8* 8.7* 7.2*  HCT 26.1*   < > 30.8* 26.3* 21.9*  MCV 91.3  --   --  87.4 88.0  PLT 135*  --   --  111* 98*   < > = values in this interval not displayed.   Blood Culture No results found for: SDES, SPECREQUEST, CULT, REPTSTATUS  Cardiac Enzymes: No results for input(s): CKTOTAL,  CKMB, CKMBINDEX, TROPONINI in the last 168 hours. CBG: No results for input(s): GLUCAP in the last 168 hours. Iron Studies: No results for input(s): IRON, TIBC, TRANSFERRIN, FERRITIN in the last 72 hours. Lab Results  Component Value Date   INR 1.1 09/15/2018   INR 1.1 08/09/2018   Studies/Results: Dg Knee 1-2 Views Right  Result Date: 09/15/2018 CLINICAL DATA:  Lateral right knee pain and limited range of motion. No known injury. EXAM: RIGHT KNEE - 1-2 VIEW COMPARISON:  None. FINDINGS: No evidence of fracture, dislocation, or joint effusion. No evidence of arthropathy or other focal bone abnormality. Atherosclerosis noted. Soft tissues are otherwise unremarkable. IMPRESSION: Normal right knee. Atherosclerosis. Electronically Signed   By: Inge Rise M.D.   On: 09/15/2018 13:49   Medications: . sodium chloride    . sodium chloride    . sodium chloride    . sodium chloride     . chlorhexidine  15 mL Mouth Rinse BID  .  Chlorhexidine Gluconate Cloth  6 each Topical Q0600  . clopidogrel  75 mg Oral Daily  . darbepoetin (ARANESP) injection - DIALYSIS  60 mcg Intravenous Q Thu-HD  . doxercalciferol  6 mcg Intravenous Q T,Th,Sa-HD  . hydroxychloroquine  200 mg Oral Q1200  . mouth rinse  15 mL Mouth Rinse q12n4p  . midodrine  10 mg Oral Q T,Th,Sa-HD  . sevelamer carbonate  800 mg Oral TID WC

## 2018-09-17 NOTE — Progress Notes (Addendum)
[facility] Progress Note   Chief Complaint:   Painless hematochezia   SUBJECTIVE:    no complaints, I awoke her from sleep. No further bleeding.    ASSESSMENT AND PLAN:    88. 73 yo female with ESRD on HD (Tu/Th/Sa,  SLE, CHB s/p pacemaker, and CVA on plavix and ASA. painless hematochezia on plavix / asa. Felt to be a diverticular hemorrhage. Resolved. Says her last BM was yesterday and it didn't contain any blood  2. ABL anemia. Hgb 7, down from 9.9 early March. Of note: her baseline hgb is around 11.9 but declined to 9.2 (just prior to a toe amputation) in early March. Nephrology attributed that drop in hgb to resumption of ESA on 2/27 ( not sure what ESA is?) -she received a unit of blood on 09/15/18, hgb settled at 8.7. She required another this am when it fell to 6.5 though no further bleeding yesterday nor today.  Post H+H pending.  -Prep for colonoscopy tomorrow. I called patient's daughter Belva Chimes to discuss. The risk / benefits were discussed.      OBJECTIVE:     Vital signs in last 24 hours: Temp:  [97.5 F (36.4 C)-98.8 F (37.1 C)] 98.2 F (36.8 C) (04/11 1030) Pulse Rate:  [67-88] 79 (04/11 1030) Resp:  [14-22] 22 (04/11 1030) BP: (105-143)/(40-77) 134/72 (04/11 1030) SpO2:  [99 %-100 %] 100 % (04/11 1030) Weight:  [74.2 kg-78.7 kg] 74.2 kg (04/11 1044) Last BM Date: 09/15/18 General:   Alert, well-developed female in NAD EENT:  Decreased hearing,  non icteric sclera, conjunctive pink.  Heart:  Regular rate and rhythm; + murmur.  No lower extremity edema   Pulm: Normal respiratory effort Abdomen:  Soft, nondistended, nontender.  Normal bowel sounds, no masses felt.       Neurologic:  Alert and  oriented x4;  grossly normal neurologically. Psych:  Pleasant, cooperative.  Normal mood and affect.   Intake/Output from previous day: No intake/output data recorded. Intake/Output this shift: Total I/O In: 315 [Blood:315] Out: 1500 [Other:1500]   Lab Results: Recent Labs    09/16/18 0747 09/17/18 0258 09/17/18 0742  WBC 9.6 7.3 6.0  HGB 8.7* 7.2* 6.5*  HCT 26.3* 21.9* 20.7*  PLT 111* 98* 96*   BMET Recent Labs    09/15/18 0536 09/17/18 0741  NA 137 135  K 4.2 3.3*  CL 96* 95*  CO2 26 25  GLUCOSE 113* 82  BUN 35* 28*  CREATININE 8.38* 6.39*  CALCIUM 9.3 8.7*   LFT Recent Labs    09/15/18 0536 09/17/18 0741  PROT 6.3*  --   ALBUMIN 2.9* 2.5*  AST 17  --   ALT 7  --   ALKPHOS 58  --   BILITOT 0.5  --    PT/INR Recent Labs    09/15/18 0536  LABPROT 13.7  INR 1.1   Hepatitis Panel No results for input(s): HEPBSAG, HCVAB, HEPAIGM, HEPBIGM in the last 72 hours.  Dg Knee 1-2 Views Right  Result Date: 09/15/2018 CLINICAL DATA:  Lateral right knee pain and limited range of motion. No known injury. EXAM: RIGHT KNEE - 1-2 VIEW COMPARISON:  None. FINDINGS: No evidence of fracture, dislocation, or joint effusion. No evidence of arthropathy or other focal bone abnormality. Atherosclerosis noted. Soft tissues are otherwise unremarkable. IMPRESSION: Normal right knee. Atherosclerosis. Electronically Signed   By: Inge Rise M.D.   On: 09/15/2018 13:49     Principal Problem:   Acute GI  bleeding Active Problems:   ESRD (end stage renal disease) on dialysis ([place])   Lupus (HCC)   Lower GI bleed   Hematochezia   Acute blood loss anemia   Platelet inhibition due to Plavix    LOS: 1 day   Tye Savoy ,NP 09/17/2018, 11:53 AM  GI ATTENDING  Interval history and data reviewed.  Patient personally seen and examined.  Agree with interval progress note as outlined above.  The patient is stable without further bleeding.  However, her hemoglobin has drifted to 6.5.  Patient is for colonoscopy tomorrow for presumed diverticular bleeding, though she has not had prior colonoscopy.The nature of the procedure, as well as the risks, benefits, and alternatives were carefully and thoroughly reviewed with the patient.  Ample time for discussion and questions allowed. The patient understood, was satisfied, and agreed to proceed.  This was also discussed with the patient's daughter by the GI nurse practitioner.  The patient is high risk given her comorbidities and chronic platelet inhibitor therapy status.  Docia Chuck. Geri Seminole., M.D. [facility] Division of Gastroenterology

## 2018-09-17 NOTE — Progress Notes (Addendum)
   Subjective: HD#1   Overnight: No acute events  Patient examined on rounds this AM. She was seen while undergoing HD. She states she feels okay today and denies further bloody BMs. She also denies lightheadedness, weakness, or abdominal pain. She overall has no complaints today. She knows that she is tentatively schedule for colonoscopy tomorrow. She had no further questions nor concerns.  Upon chart review after visiting patient, she is now noted to have repeat Hgb 6.5, 1U of pRBCs has been ordered by nephrology to be given with HD.  Objective:  Vital signs in last 24 hours: Vitals:   09/17/18 0930 09/17/18 0945 09/17/18 1000 09/17/18 1030  BP: 107/64 123/68 117/73 134/72  Pulse: 76 80 78 79  Resp: (!) 21   (!) 22  Temp: 98.2 F (36.8 C)   98.2 F (36.8 C)  TempSrc: Oral   Oral  SpO2: 100%   100%  Weight:      Height:       Const: Lying comfortably in bed, in no apparent distress, speaks in full sentences Resp: Clear to auscultation bilaterally CV: RRR, systolic murmur, gallops, rubs Abd: Bowel sounds present, nontender to palpation Ext: No edema, no tenderness Skin: No skin pallor, old scar at the left upper extremity.  Assessment/Plan:  Principal Problem:   Acute GI bleeding Active Problems:   ESRD (end stage renal disease) on dialysis (HCC)   Lupus (HCC)   Lower GI bleed   Hematochezia   Acute blood loss anemia   Platelet inhibition due to Plavix  73 yo F w. Hx of ESRD (HD TuThS), SLE, HTN, paroxysmal tachycardia, complete heart block (s/p pacemaker), CVA (on aspirin and Plavix; residual left lower extremity weakness, dysarthria), and dementia who presented to [facility] emergency department on September 15, 2018 with an acute onset bright red blood per rectum.  #Acute lower GI bleed: Initially unclear if Vaginal or GI in origin, then clarified it was BRBPR and GU exam neg for obvious bleeding. She was transfused 1U pRBCs and Hgb improved to 9.8. Differential includes  hemorrhoids v. anal fissure v. Diverticular v. Angiodysplasia v. AVM v. Malignancy. > Hgb 6.5 at HD today, 1U pRBCs ordered by nephrology - Appreciate GI recoomendations - CTA if significant acute bleeding or hemodynamically unstable. - Colonoscopy planned for tomorrow 4/12  - Transfuse if hemoglobin <7 - F/U post transfusion Hgb - Trend CBC  #ESRD on HD TTS: She is adherent w/ HD (TuThS). On Midodrine pre-HD for Hypotension - Nephrology following for HD while Admitted  #Thrombocytopenia: PLTs downtrending 134>>96 this admission (was 94 one month ago). Nephrology stopping heparin as a precaution, agree with this. - Continue to monitor  #Systemic lupus erythematosus: - Continue Plaquenil  #History of CVA: Hx of CVA (date unclear). Residual left-sided weakness and dysarthria. On dual antiplatelet therapy with ASA and Plavix. GI okay with continuing Plavix - Continue Plavix  #Hypertension/Hypotension: BP has been labile with range 90s-130s/40s-80s - Holding Coreg - Continue Midodrine with HD  #History of paroxysmal tachycardia #History of complete heart block: Initially tachy in ED (110s). Resolved w/ IVFs and blood transfusion. > Status post pacemaker placement - Continue to monitor  FEN: Replace electrolytes as needed, Soft diet, NPO MN VTE ppx: SCDs CODE STATUS: DNR  Dispo: Anticipated discharge to skilled nursing facility in approximately 1-2 day(s).   Neva Seat, MD 09/17/2018, 10:39 AM

## 2018-09-17 NOTE — H&P (View-Only) (Signed)
[facility] Progress Note   Chief Complaint:   Painless hematochezia   SUBJECTIVE:    no complaints, I awoke her from sleep. No further bleeding.    ASSESSMENT AND PLAN:    88. 73 yo female with ESRD on HD (Tu/Th/Sa,  SLE, CHB s/p pacemaker, and CVA on plavix and ASA. painless hematochezia on plavix / asa. Felt to be a diverticular hemorrhage. Resolved. Says her last BM was yesterday and it didn't contain any blood  2. ABL anemia. Hgb 7, down from 9.9 early March. Of note: her baseline hgb is around 11.9 but declined to 9.2 (just prior to a toe amputation) in early March. Nephrology attributed that drop in hgb to resumption of ESA on 2/27 ( not sure what ESA is?) -she received a unit of blood on 09/15/18, hgb settled at 8.7. She required another this am when it fell to 6.5 though no further bleeding yesterday nor today.  Post H+H pending.  -Prep for colonoscopy tomorrow. I called patient's daughter Abigail Buck to discuss. The risk / benefits were discussed.      OBJECTIVE:     Vital signs in last 24 hours: Temp:  [97.5 F (36.4 C)-98.8 F (37.1 C)] 98.2 F (36.8 C) (04/11 1030) Pulse Rate:  [67-88] 79 (04/11 1030) Resp:  [14-22] 22 (04/11 1030) BP: (105-143)/(40-77) 134/72 (04/11 1030) SpO2:  [99 %-100 %] 100 % (04/11 1030) Weight:  [74.2 kg-78.7 kg] 74.2 kg (04/11 1044) Last BM Date: 09/15/18 General:   Alert, well-developed female in NAD EENT:  Decreased hearing,  non icteric sclera, conjunctive pink.  Heart:  Regular rate and rhythm; + murmur.  No lower extremity edema   Pulm: Normal respiratory effort Abdomen:  Soft, nondistended, nontender.  Normal bowel sounds, no masses felt.       Neurologic:  Alert and  oriented x4;  grossly normal neurologically. Psych:  Pleasant, cooperative.  Normal mood and affect.   Intake/Output from previous day: No intake/output data recorded. Intake/Output this shift: Total I/O In: 315 [Blood:315] Out: 1500 [Other:1500]   Lab Results: Recent Labs    09/16/18 0747 09/17/18 0258 09/17/18 0742  WBC 9.6 7.3 6.0  HGB 8.7* 7.2* 6.5*  HCT 26.3* 21.9* 20.7*  PLT 111* 98* 96*   BMET Recent Labs    09/15/18 0536 09/17/18 0741  NA 137 135  K 4.2 3.3*  CL 96* 95*  CO2 26 25  GLUCOSE 113* 82  BUN 35* 28*  CREATININE 8.38* 6.39*  CALCIUM 9.3 8.7*   LFT Recent Labs    09/15/18 0536 09/17/18 0741  PROT 6.3*  --   ALBUMIN 2.9* 2.5*  AST 17  --   ALT 7  --   ALKPHOS 58  --   BILITOT 0.5  --    PT/INR Recent Labs    09/15/18 0536  LABPROT 13.7  INR 1.1   Hepatitis Panel No results for input(s): HEPBSAG, HCVAB, HEPAIGM, HEPBIGM in the last 72 hours.  Dg Knee 1-2 Views Right  Result Date: 09/15/2018 CLINICAL DATA:  Lateral right knee pain and limited range of motion. No known injury. EXAM: RIGHT KNEE - 1-2 VIEW COMPARISON:  None. FINDINGS: No evidence of fracture, dislocation, or joint effusion. No evidence of arthropathy or other focal bone abnormality. Atherosclerosis noted. Soft tissues are otherwise unremarkable. IMPRESSION: Normal right knee. Atherosclerosis. Electronically Signed   By: Inge Rise M.D.   On: 09/15/2018 13:49     Principal Problem:   Acute GI  bleeding Active Problems:   ESRD (end stage renal disease) on dialysis ([facility])   Lupus (HCC)   Lower GI bleed   Hematochezia   Acute blood loss anemia   Platelet inhibition due to Plavix    LOS: 1 day   Abigail Buck ,NP 09/17/2018, 11:53 AM  GI ATTENDING  Interval history and data reviewed.  Patient personally seen and examined.  Agree with interval progress note as outlined above.  The patient is stable without further bleeding.  However, her hemoglobin has drifted to 6.5.  Patient is for colonoscopy tomorrow for presumed diverticular bleeding, though she has not had prior colonoscopy.The nature of the procedure, as well as the risks, benefits, and alternatives were carefully and thoroughly reviewed with the patient.  Ample time for discussion and questions allowed. The patient understood, was satisfied, and agreed to proceed.  This was also discussed with the patient's daughter by the GI nurse practitioner.  The patient is high risk given her comorbidities and chronic platelet inhibitor therapy status.  Docia Chuck. Geri Seminole., M.D. [facility] of Gastroenterology

## 2018-09-17 NOTE — Progress Notes (Signed)
Internal Medicine Attending:   I saw and examined the patient. I reviewed Dr Melvin's note and I agree with the resident's findings and plan as documented in the resident's note.  

## 2018-09-18 ENCOUNTER — Encounter (HOSPITAL_COMMUNITY)
Admission: EM | Disposition: A | Discharge status: Skilled Nursing Facility | Payer: Self-pay | Source: Home / Self Care | Attending: Internal Medicine

## 2018-09-18 ENCOUNTER — Inpatient Hospital Stay (HOSPITAL_COMMUNITY): Payer: Medicare Other | Admitting: Certified Registered Nurse Anesthetist

## 2018-09-18 ENCOUNTER — Encounter (HOSPITAL_COMMUNITY): Payer: Self-pay | Admitting: *Deleted

## 2018-09-18 DIAGNOSIS — Z8679 Personal history of other diseases of the circulatory system: Secondary | ICD-10-CM

## 2018-09-18 DIAGNOSIS — K5730 Diverticulosis of large intestine without perforation or abscess without bleeding: Secondary | ICD-10-CM

## 2018-09-18 HISTORY — PX: COLONOSCOPY: SHX5424

## 2018-09-18 LAB — CBC
HCT: 29 % — ABNORMAL LOW (ref 36.0–46.0)
Hemoglobin: 9.4 g/dL — ABNORMAL LOW (ref 12.0–15.0)
MCH: 28.1 pg (ref 26.0–34.0)
MCHC: 32.4 g/dL (ref 30.0–36.0)
MCV: 86.6 fL (ref 80.0–100.0)
Platelets: 122 10*3/uL — ABNORMAL LOW (ref 150–400)
RBC: 3.35 MIL/uL — ABNORMAL LOW (ref 3.87–5.11)
RDW: 14 % (ref 11.5–15.5)
WBC: 7.5 10*3/uL (ref 4.0–10.5)
nRBC: 0 % (ref 0.0–0.2)

## 2018-09-18 LAB — TYPE AND SCREEN
ABO/RH(D): O NEG
Antibody Screen: NEGATIVE
Unit division: 0
Unit division: 0

## 2018-09-18 LAB — RENAL FUNCTION PANEL
Albumin: 3.1 g/dL — ABNORMAL LOW (ref 3.5–5.0)
Anion gap: 14 (ref 5–15)
BUN: 16 mg/dL (ref 8–23)
CO2: 25 mmol/L (ref 22–32)
Calcium: 9.4 mg/dL (ref 8.9–10.3)
Chloride: 98 mmol/L (ref 98–111)
Creatinine, Ser: 4.45 mg/dL — ABNORMAL HIGH (ref 0.44–1.00)
GFR calc Af Amer: 11 mL/min — ABNORMAL LOW (ref >60)
GFR calc non Af Amer: 9 mL/min — ABNORMAL LOW (ref >60)
Glucose, Bld: 89 mg/dL (ref 70–99)
Phosphorus: 3.6 mg/dL (ref 2.5–4.6)
Potassium: 3.3 mmol/L — ABNORMAL LOW (ref 3.5–5.1)
Sodium: 137 mmol/L (ref 135–145)

## 2018-09-18 LAB — BPAM RBC
Blood Product Expiration Date: 202004142359
Blood Product Expiration Date: 202004152359
ISSUE DATE / TIME: 202004091550
ISSUE DATE / TIME: 202004110936
Unit Type and Rh: 9500
Unit Type and Rh: 9500

## 2018-09-18 SURGERY — COLONOSCOPY
Anesthesia: Monitor Anesthesia Care

## 2018-09-18 MED ORDER — SODIUM CHLORIDE 0.9 % IV SOLN
INTRAVENOUS | Status: DC
  Administered 2018-09-18: 10:00:00 via INTRAVENOUS

## 2018-09-18 MED ORDER — PROPOFOL 10 MG/ML IV BOLUS
INTRAVENOUS | Status: DC | PRN
  Administered 2018-09-18 (×2): 20 mg via INTRAVENOUS

## 2018-09-18 MED ORDER — PROPOFOL 500 MG/50ML IV EMUL
INTRAVENOUS | Status: DC | PRN
  Administered 2018-09-18: 50 ug/kg/min via INTRAVENOUS

## 2018-09-18 NOTE — Anesthesia Preprocedure Evaluation (Signed)
Anesthesia Evaluation  Patient identified by MRN, date of birth, ID band Patient awake    Reviewed: Allergy & Precautions, NPO status , Patient's Chart, lab work & pertinent test results  History of Anesthesia Complications Negative for: history of anesthetic complications  Airway Mallampati: II  TM Distance: >3 FB Neck ROM: Full    Dental  (+) Edentulous Upper, Dental Advisory Given, Edentulous Lower, Upper Dentures   Pulmonary neg pulmonary ROS,    breath sounds clear to auscultation       Cardiovascular + pacemaker  Rhythm:Regular Rate:Normal  ECHO: The left ventricle is normal in size. There is mild concentric left ventricular hypertrophy with normal wall motion and ejection fraction 65- 70%. Grade I mild diastolic dysfunction; abnormal relaxation pattern.   Neuro/Psych Left weakness CVA, Residual Symptoms negative psych ROS   GI/Hepatic negative GI ROS, Neg liver ROS,   Endo/Other  negative endocrine ROS  Renal/GU ESRF and DialysisRenal disease     Musculoskeletal negative musculoskeletal ROS (+)   Abdominal   Peds  Hematology  (+) Blood dyscrasia, anemia , Lupus    Anesthesia Other Findings Right toe nonviable tissue  Reproductive/Obstetrics                             Anesthesia Physical Anesthesia Plan  ASA: IV  Anesthesia Plan: MAC   Post-op Pain Management:    Induction: Intravenous  PONV Risk Score and Plan: 2 and Treatment may vary due to age or medical condition and Propofol infusion  Airway Management Planned: Nasal Cannula  Additional Equipment: None  Intra-op Plan:   Post-operative Plan:   Informed Consent: I have reviewed the patients History and Physical, chart, labs and discussed the procedure including the risks, benefits and alternatives for the proposed anesthesia with the patient or authorized representative who has indicated his/her understanding and  acceptance.     Dental advisory given  Plan Discussed with: CRNA and Surgeon  Anesthesia Plan Comments:         Anesthesia Quick Evaluation

## 2018-09-18 NOTE — Transfer of Care (Signed)
Immediate Anesthesia Transfer of Care Note  Patient: Abigail Buck  Procedure(s) Performed: COLONOSCOPY (N/A )  Patient Location: Endoscopy Unit  Anesthesia Type:MAC  Level of Consciousness: drowsy and patient cooperative  Airway & Oxygen Therapy: Patient Spontanous Breathing and Patient connected to nasal cannula oxygen  Post-op Assessment: Report given to RN, Post -op Vital signs reviewed and stable and Patient moving all extremities X 4  Post vital signs: Reviewed and stable  Last Vitals:  Vitals Value Taken Time  BP    Temp    Pulse    Resp    SpO2      Last Pain:  Vitals:   09/18/18 0935  TempSrc: Oral  PainSc: 0-No pain         Complications: No apparent anesthesia complications

## 2018-09-18 NOTE — Anesthesia Postprocedure Evaluation (Signed)
Anesthesia Post Note  Patient: Abigail Buck  Procedure(s) Performed: COLONOSCOPY (N/A )     Patient location during evaluation: Endoscopy Anesthesia Type: MAC Level of consciousness: awake and alert Pain management: pain level controlled Vital Signs Assessment: post-procedure vital signs reviewed and stable Respiratory status: spontaneous breathing, nonlabored ventilation, respiratory function stable and patient connected to nasal cannula oxygen Cardiovascular status: stable and blood pressure returned to baseline Postop Assessment: no apparent nausea or vomiting Anesthetic complications: no    Last Vitals:  Vitals:   09/18/18 1106 09/18/18 1337  BP: (!) 124/56 (!) 144/52  Pulse:  89  Resp: (!) 21 20  Temp:  37.2 C  SpO2: 99% 99%    Last Pain:  Vitals:   09/18/18 1337  TempSrc: Oral  PainSc:                  [name]

## 2018-09-18 NOTE — Progress Notes (Addendum)
[facility] Progress Note   Dialysis Orders: [place] T,Th,S 3 hr 15 min 180NRe 400/800 79.8 kg. 2.0 K/ 2.25 Ca [facility] TDC (needs site corrected in ecube) -Heparin 4000 units IV initial bolus Heparin 1000 units IV mid run TIW -Mircera 60 mcg IV q 4 week (last dose 08/18/2018-due today) -Venofer 50 mg IV weekly (Start today) -Parsabiv 5 mg IV TIW -Hectorol 6 mcg IV TIW  Assessment/Plan: 1. BRBPR-painless hematochezia  Colonoscopy showed diverticulosis no source of bleeding. Continue plavix  2. ESRD - T,Th,S via TDC. No heparin. DC heparin on discharge. K 3.3 - 4 K bath Saturday -next HD Tuesday 3. Hypertension/volume - Net UF 1.5 Post wt 75.8 4/9 and net UF 1.5 Sat with post wt 74.2 . Has been leaving under EDW at OP clinic. Lower EDW on DC. On midodrine 10 mg PO T,Th,S prior to HD. Low dose carvedilol.  4. Anemia -hgb 6.5 4/11 s/p 1 unit 4/9 and another unit 4/11 - hgb up 9.2 yesterday and stable at 9.4 4/12 .^ Arnaesp to 100 next dose- weekly Fe - had one dose Thursday -reorder if still here otherwise continue at outpatient unit  5. Metabolic bone disease -Ca/P ok. Continue renvela binders, VDRA.Parsabiv not on hospital formulary.  6. Nutrition - Albumin 2.5 Renal diet with fld restrictions, prostat, renal vits. 7. SLE-per primary. On Plaquenil. 8. PAD-S/P R toe amp. Per primary.  9. H/O CHB-has PPM.  10. Thrombocytopenia - plts declining but trending back up off heparin watch 11. Disp - to return to previous living facility   Myriam Jacobson, [place] [phone] 09/18/2018,11:49 AM  LOS: 2 days   Subjective:   Told me she got a good report from the doctor.  Objective Vitals:   09/18/18 0552 09/18/18 0935 09/18/18 1055 09/18/18 1106  BP: 132/85 (!) 132/58 (!) 138/44 (!) 124/56  Pulse: 80     Resp:  20 (!) 22 (!) 21  Temp: 98.6 F (37 C) 98.7 F (37.1 C)    TempSrc: Oral Oral    SpO2: 100% 99% 100% 99%  Weight:  74.2 kg     Height:  5\' 6"  (1.676 m)     Physical Exam General: NAD  - HOH, talkative good spirits Heart: RRR Lungs: no rales anteriorly Abdomen: obese soft NT Extremities: no sig LE edema Dialysis Access: left IJ [facility]   Additional Objective Labs: Basic Metabolic Panel: Recent Labs  Lab 09/15/18 0536 09/17/18 0741 09/18/18 0224  NA 137 135 137  K 4.2 3.3* 3.3*  CL 96* 95* 98  CO2 26 25 25   GLUCOSE 113* 82 89  BUN 35* 28* 16  CREATININE 8.38* 6.39* 4.45*  CALCIUM 9.3 8.7* 9.4  PHOS  --  4.8* 3.6   Liver Function Tests: Recent Labs  Lab 09/15/18 0536 09/17/18 0741 09/18/18 0224  AST 17  --   --   ALT 7  --   --   ALKPHOS 58  --   --   BILITOT 0.5  --   --   PROT 6.3*  --   --   ALBUMIN 2.9* 2.5* 3.1*   No results for input(s): LIPASE, AMYLASE in the last 168 hours. CBC: Recent Labs  Lab 09/15/18 0536  09/16/18 0747 09/17/18 0258 09/17/18 0742 09/17/18 1607 09/18/18 0224  WBC 9.4  --  9.6 7.3 6.0 8.2 7.5  NEUTROABS 7.5  --   --   --   --   --   --  HGB 8.2*   < > 8.7* 7.2* 6.5* 9.2* 9.4*  HCT 26.1*   < > 26.3* 21.9* 20.7* 28.6* 29.0*  MCV 91.3  --  87.4 88.0 88.8 86.1 86.6  PLT 135*  --  111* 98* 96* 118* 122*   < > = values in this interval not displayed.    Lab Results  Component Value Date   INR 1.1 09/15/2018   INR 1.1 08/09/2018   Studies/Results: No results found. Medications:  . sodium chloride   Intravenous Once  . chlorhexidine  15 mL Mouth Rinse BID  . Chlorhexidine Gluconate Cloth  6 each Topical Q0600  . clopidogrel  75 mg Oral Daily  . [START ON 09/22/2018] darbepoetin (ARANESP) injection - DIALYSIS  100 mcg Intravenous Q Thu-HD  . doxercalciferol  6 mcg Intravenous Q T,Th,Sa-HD  . hydroxychloroquine  200 mg Oral Q1200  . mouth rinse  15 mL Mouth Rinse q12n4p  . midodrine  10 mg Oral Q T,Th,Sa-HD  . sevelamer carbonate  800 mg Oral TID WC

## 2018-09-18 NOTE — Progress Notes (Signed)
Patient back to room from colonoscopy. Patient in good spirits with no complaints. Lunch tray ordered. Bed alarm on and audible. Will continue to monitor.  Hiram Comber, RN 09/18/2018 12:04pm

## 2018-09-18 NOTE — Interval H&P Note (Signed)
History and Physical Interval Note:  09/18/2018 10:26 AM  Abigail Buck  has presented today for surgery, with the diagnosis of lower gastrointestinal bleeding.  The various methods of treatment have been discussed with the patient and family. After consideration of risks, benefits and other options for treatment, the patient has consented to  Procedure(s): COLONOSCOPY (N/A) as a surgical intervention.  The patient's history has been reviewed, patient examined, no change in status, stable for surgery.  I have reviewed the patient's chart and labs.  Questions were answered to the patient's satisfaction.     Scarlette Shorts

## 2018-09-18 NOTE — Op Note (Signed)
[facility] Patient Name: Abigail Buck Procedure Date : 09/18/2018 MRN: [id number] Attending MD: Docia Chuck. Henrene Pastor , MD Date of Birth: July 29, 1945 CSN: [id number] Age: 73 Admit Type: Inpatient Procedure:                Colonoscopy Indications:              Rectal bleeding Providers:                Docia Chuck. Henrene Pastor, MD, Carlyn Reichert, RN, Laverda Sorenson,                            Technician, Wyatt Haste Referring MD:             [facility] hospitalist Medicines:                Monitored Anesthesia Care Complications:            No immediate complications. Estimated blood loss:                            None. Estimated Blood Loss:     Estimated blood loss: none. Procedure:                Pre-Anesthesia Assessment:                           - Prior to the procedure, a History and Physical                            was performed, and patient medications and                            allergies were reviewed. The patient's tolerance of                            previous anesthesia was also reviewed. The risks                            and benefits of the procedure and the sedation                            options and risks were discussed with the patient.                            All questions were answered, and informed consent                            was obtained. Prior Anticoagulants: The patient has                            taken Plavix (clopidogrel), last dose was 7 days                            prior to procedure. ASA Grade Assessment: III - A  patient with severe systemic disease. After                            reviewing the risks and benefits, the patient was                            deemed in satisfactory condition to undergo the                            procedure.                           After obtaining informed consent, the colonoscope                            was passed under direct vision. Throughout the             procedure, the patient's blood pressure, pulse, and                            oxygen saturations were monitored continuously. The                            [id number]-HQ190L ([id number] Olympus colonoscope was                            introduced through the anus and advanced to the the                            cecum, identified by appendiceal orifice and                            ileocecal valve. The ileocecal valve, appendiceal                            orifice, and rectum were photographed. The quality                            of the bowel preparation was good. The colonoscopy                            was performed without difficulty. The patient                            tolerated the procedure well. The bowel preparation                            used was SUPREP via split dose instruction. Scope In: 10:37:51 AM Scope Out: 10:50:01 AM Scope Withdrawal Time: 0 hours 6 minutes 4 seconds  Total Procedure Duration: 0 hours 12 minutes 10 seconds  Findings:      A few scattered diverticula were found in the entire colon.      The exam was otherwise without abnormality on direct and retroflexion       views. No blood or active bleeding. Impression:               -  Diverticulosis in the entire examined colon.                           - The examination was otherwise normal on direct                            and retroflexion views.                           - No no blood or active bleeding. Recommendation:           - Repeat colonoscopy is not recommended for                            surveillance.                           -Okay to resume Plavix (clopidogrel) today at prior                            dose if medically important.                           - Resume previous diet.                           No further plans from GI perspective. No GI                            outpatient follow-up required. The results of the                            procedure have been  reviewed with the patient who                            has also been provided a copy of this report. We                            will sign off. Procedure Code(s):        --- Professional ---                           [phone], Colonoscopy, flexible; diagnostic, including                            collection of specimen(s) by brushing or washing,                            when performed (separate procedure) Diagnosis Code(s):        --- Professional ---                           K62.5, Hemorrhage of anus and rectum                           K57.30, Diverticulosis of large intestine without  perforation or abscess without bleeding CPT copyright 2019 [employer]. All rights reserved. The codes documented in this report are preliminary and upon coder review may  be revised to meet current compliance requirements. Docia Chuck. Henrene Pastor, MD 09/18/2018 11:01:01 AM This report has been signed electronically. Number of Addenda: 0

## 2018-09-18 NOTE — Progress Notes (Addendum)
   Subjective: HD#2   Overnight: No acute events reported  Today, Ms. Harpy was evaluated while pleasantly lying in bed.  She continues to deny any symptoms of GI bleed or cardiac symptoms.  She has been able to even touch with her daughter since admission.  She was started on bowel prep in preparation for colonoscopy today and states she does not like how it takes.  Otherwise she is doing well.  I explained to her the next step of management during this hospitalization and she expressed understanding.  Objective:  Vital signs in last 24 hours: Vitals:   09/17/18 1044 09/17/18 1330 09/17/18 2029 09/18/18 0552  BP:  (!) 114/47 135/71 132/85  Pulse:  79 80 80  Resp:  20    Temp:  99.2 F (37.3 C) 99.4 F (37.4 C) 98.6 F (37 C)  TempSrc:  Oral Oral Oral  SpO2:  98% 100% 100%  Weight: 74.2 kg     Height:       Constitutional: Lying comfortably in bed, in no apparent distress Respiratory: Clear to auscultation, no wheezes, crackles, rhonchi Cardiovascular: RRR, no murmurs, gallops, rubs Abdomen: Bowel sounds present, nontender to palpation Skin: No skin pallor  Assessment/Plan:  Principal Problem:   Acute GI bleeding Active Problems:   ESRD (end stage renal disease) on dialysis (HCC)   Lupus (HCC)   Lower GI bleed   Hematochezia   Acute blood loss anemia   Platelet inhibition due to Plavix  73 yo F w. Hx of ESRD (HD TuThS), SLE, HTN, paroxysmal tachycardia, complete heart block (s/p pacemaker), CVA (on aspirin and Plavix; residual left lower extremity weakness, dysarthria), and dementia who presented to [facility] emergency department on September 15, 2018 with an acute onset bright red blood per rectum.  #Acute lower GI bleed #Diverticulosis of entire colon  Continue to remain asymptomatic, currently denies weakness, malaise, dizziness, lightheadedness, melena, hematochezia.  She is tolerating bowel preparation in anticipation for colonoscopy today.  Her hemoglobin is stable  this morning at 9.4 after receiving 1 unit PRBC transfusion yesterday.   -Appreciate GI recommendations -Monitor hemoglobin and transfuse if<7  #ESRD on HD TTS: Successfully underwent hemodialysis yesterday -Continue inpatient HD while admitted -Continue nephrology recommendations  #Thrombocytopenia: CBC yesterday revealed a low platelet count of 96 from 135 on admission.  Given this, heparin was held.  This a.m., platelets are stable at 122 - Continue to monitor - Continue to hold heparin  #Systemic lupus erythematosus: - Continue Plaquenil  #History of CVA: Hx of CVA (date unclear). Residual left-sided weakness and dysarthria. On dual antiplatelet therapy with ASA and Plavix. GI okay with continuing Plavix - Continue Plavix  #Hypertension/Hypotension: BP this am WNL - Holding Coreg -Continue Midodrine with HD  #History of paroxysmal tachycardia #History of complete heart block Remains asymptomatic.  Her vital signs has remained stable with HR 60s- 80s. - Status post pacemaker placement - Continue to monitor  FEN: Replace electrolytes as needed, NPO  VTE ppx: SCDs CODE STATUS: DNR  Dispo: Anticipated discharge to skilled nursing facility in approximately 1-2 day(s).   Jean Rosenthal, MD 09/18/2018, 6:58 AM Pager: [phone] IMTS PGY-1

## 2018-09-19 LAB — CBC
HCT: 28.6 % — ABNORMAL LOW (ref 36.0–46.0)
Hemoglobin: 9.2 g/dL — ABNORMAL LOW (ref 12.0–15.0)
MCH: 28 pg (ref 26.0–34.0)
MCHC: 32.2 g/dL (ref 30.0–36.0)
MCV: 87.2 fL (ref 80.0–100.0)
Platelets: 145 10*3/uL — ABNORMAL LOW (ref 150–400)
RBC: 3.28 MIL/uL — ABNORMAL LOW (ref 3.87–5.11)
RDW: 13.8 % (ref 11.5–15.5)
WBC: 7.3 10*3/uL (ref 4.0–10.5)
nRBC: 0 % (ref 0.0–0.2)

## 2018-09-19 LAB — RENAL FUNCTION PANEL
Albumin: 3.1 g/dL — ABNORMAL LOW (ref 3.5–5.0)
Anion gap: 14 (ref 5–15)
BUN: 24 mg/dL — ABNORMAL HIGH (ref 8–23)
CO2: 26 mmol/L (ref 22–32)
Calcium: 9.6 mg/dL (ref 8.9–10.3)
Chloride: 99 mmol/L (ref 98–111)
Creatinine, Ser: 7.02 mg/dL — ABNORMAL HIGH (ref 0.44–1.00)
GFR calc Af Amer: 6 mL/min — ABNORMAL LOW (ref >60)
GFR calc non Af Amer: 5 mL/min — ABNORMAL LOW (ref >60)
Glucose, Bld: 102 mg/dL — ABNORMAL HIGH (ref 70–99)
Phosphorus: 3.9 mg/dL (ref 2.5–4.6)
Potassium: 3.5 mmol/L (ref 3.5–5.1)
Sodium: 139 mmol/L (ref 135–145)

## 2018-09-19 LAB — MAGNESIUM: Magnesium: 2.3 mg/dL (ref 1.7–2.4)

## 2018-09-19 MED ORDER — CARVEDILOL 6.25 MG PO TABS
6.2500 mg | ORAL_TABLET | Freq: Two times a day (BID) | ORAL | Status: DC
  Administered 2018-09-19: 6.25 mg via ORAL
  Filled 2018-09-19: qty 1

## 2018-09-19 NOTE — Progress Notes (Signed)
Report called to receiving nurse at [place]. Questions answered fully.

## 2018-09-19 NOTE — Progress Notes (Signed)
OP HD Clinic Manager notified of planned hospital discharge today with next scheduled HD treatment tomorrow in-center.   Benisha Hadaway [facility] Coordinator [phone] 

## 2018-09-19 NOTE — Progress Notes (Signed)
   Subjective: Abigail Buck had run of sustained asymptomatic Vtach overnight. Her home Carvedilol was restarted with normalization in heart rate to 60s-80s.  This morning she is feeling well and looks forward to getting out of the hospital. She denies any abdominal pain, n/v.   Objective:  Vital signs in last 24 hours: Vitals:   09/18/18 1337 09/18/18 2107 09/19/18 0310 09/19/18 0507  BP: (!) 144/52 128/71 122/80 140/82  Pulse: 89 98 (!) 124 88  Resp: 20     Temp: 99 F (37.2 C) 99.1 F (37.3 C) 98.2 F (36.8 C) 98.4 F (36.9 C)  TempSrc: Oral Oral Oral Oral  SpO2: 99% 95% 100% 96%  Weight:      Height:       General: awake, alert, lying comfortably in bed CV: RRR; no murmurs, rubs or gallops Pulm: normal respiratory effort on room air; lungs CTAB Abd: BS+; abdomen is soft, non-tender, non-distended   Assessment/Plan:  Principal Problem:   Acute GI bleeding Active Problems:   ESRD (end stage renal disease) on dialysis (HCC)   Lupus (HCC)   Lower GI bleed   Hematochezia   Acute blood loss anemia   Platelet inhibition due to Plavix   Diverticulosis of colon without hemorrhage  73yo F w. Hx ofESRD (HD TuThS), SLE, HTN,paroxysmal tachycardia, complete heart block (s/ppacemaker), CVA (on aspirin and Plavix;residual left lower extremity weakness,dysarthria),anddementia who presented to [facility] emergency department on September 15, 2018 with an acute onset bright red blood per rectum.  #Acute lower GI bleed #Diverticulosis of entire colon  Greatly appreciate GI consult. She underwent colonoscopy on 4/12 which showed diffuse diverticulosis without evidence of active bleeding. Did not recommend repeat colonoscopy for surveillance.  - she has received total of 2 units pRBCs this admission - Hgb has remained stable > 48 hours; 9.2 this morning - she is medically stable for discharge back to SNF; appreciate CSW assistance with this   #ESRD on HD TTS:  - appreciate  nephrology management while inpatient  #Thrombocytopenia: platelet count trended down during admission, but subsequently improved after discontinuing heparin.  - platelets stable at 145 today - continue holding heparin    #Systemic lupus erythematosus: -Continue Plaquenil  #History of CVA:Hx of CVA (date unclear). Residualleft-sided weakness and dysarthria.On dual antiplatelet therapy withASAand Plavix. GI okay with continuingPlavix -Continue Plavix; aspirin discontinued   #Hypertension/Hypotension: BP this am WNL -HoldingCoreg -Continue Midodrine with HD  #History of paroxysmal tachycardia #History of complete heart block - patient had run of sustained asymptomatic vtach overnight - heart rate has remained in 60s-80s after resuming home Carvedilol -Status post pacemaker placement  Dispo: Patient is medically stable for discharge back to SNF.   Modena Nunnery D, DO 09/19/2018, 7:03 AM Pager: [phone]

## 2018-09-19 NOTE — Progress Notes (Addendum)
[facility] Progress Note   Subjective: No C/Os. Asking when she will leave hospital. No C/Os.    Objective Vitals:   09/18/18 1337 09/18/18 2107 09/19/18 0310 09/19/18 0507  BP: (!) 144/52 128/71 122/80 140/82  Pulse: 89 98 (!) 124 88  Resp: 20     Temp: 99 F (37.2 C) 99.1 F (37.3 C) 98.2 F (36.8 C) 98.4 F (36.9 C)  TempSrc: Oral Oral Oral Oral  SpO2: 99% 95% 100% 96%  Weight:      Height:       Physical Exam General: Elderly female in NAD Heart: S1,S2 RRR V pacing on monitor.  Lungs: CTAB A/P Abdomen: Active BS Extremities: No LE edema. S/P R great toe amp-suture line intact.  Dialysis Access: [facility].   Additional Objective Labs: Basic Metabolic Panel: Recent Labs  Lab 09/17/18 0741 09/18/18 0224 09/19/18 0523  NA 135 137 139  K 3.3* 3.3* 3.5  CL 95* 98 99  CO2 25 25 26   GLUCOSE 82 89 102*  BUN 28* 16 24*  CREATININE 6.39* 4.45* 7.02*  CALCIUM 8.7* 9.4 9.6  PHOS 4.8* 3.6 3.9   Liver Function Tests: Recent Labs  Lab 09/15/18 0536 09/17/18 0741 09/18/18 0224 09/19/18 0523  AST 17  --   --   --   ALT 7  --   --   --   ALKPHOS 58  --   --   --   BILITOT 0.5  --   --   --   PROT 6.3*  --   --   --   ALBUMIN 2.9* 2.5* 3.1* 3.1*   No results for input(s): LIPASE, AMYLASE in the last 168 hours. CBC: Recent Labs  Lab 09/15/18 0536  09/17/18 0258 09/17/18 0742 09/17/18 1607 09/18/18 0224 09/19/18 0523  WBC 9.4   < > 7.3 6.0 8.2 7.5 7.3  NEUTROABS 7.5  --   --   --   --   --   --   HGB 8.2*   < > 7.2* 6.5* 9.2* 9.4* 9.2*  HCT 26.1*   < > 21.9* 20.7* 28.6* 29.0* 28.6*  MCV 91.3   < > 88.0 88.8 86.1 86.6 87.2  PLT 135*   < > 98* 96* 118* 122* 145*   < > = values in this interval not displayed.   Blood Culture No results found for: SDES, SPECREQUEST, CULT, REPTSTATUS  Cardiac Enzymes: No results for input(s): CKTOTAL, CKMB, CKMBINDEX, TROPONINI in the last 168 hours. CBG: No results for input(s): GLUCAP in the  last 168 hours. Iron Studies: No results for input(s): IRON, TIBC, TRANSFERRIN, FERRITIN in the last 72 hours. @lablastinr3 @ Studies/Results: No results found. Medications:  . sodium chloride   Intravenous Once  . carvedilol  6.25 mg Oral BID WC  . chlorhexidine  15 mL Mouth Rinse BID  . Chlorhexidine Gluconate Cloth  6 each Topical Q0600  . clopidogrel  75 mg Oral Daily  . [START ON 09/22/2018] darbepoetin (ARANESP) injection - DIALYSIS  100 mcg Intravenous Q Thu-HD  . doxercalciferol  6 mcg Intravenous Q T,Th,Sa-HD  . hydroxychloroquine  200 mg Oral Q1200  . mouth rinse  15 mL Mouth Rinse q12n4p  . midodrine  10 mg Oral Q T,Th,Sa-HD  . sevelamer carbonate  800 mg Oral TID WC     Dialysis Orders: [place] T,Th,S 3 hr 15 min 180NRe 400/800 79.8 kg. 2.0 K/ 2.25 Ca [facility] TDC (needs site corrected in ecube) -  Heparin 4000 units IV initial bolus Heparin 1000 units IV mid run TIW -Mircera 60 mcg IV q 4 week (last dose 08/18/2018-due today) -Venofer 50 mg IV weekly (Start today) -Parsabiv 5 mg IV TIW -Hectorol 6 mcg IV TIW  Assessment/Plan: 1. BRBPR-painless hematochezia  Colonoscopy showed diverticulosis no source of bleeding. Continue plavix  2. ESRD - T,Th,S via TDC. No heparin. DC heparin on discharge.K 3.3 - 4 K bath Saturday -next HD Tuesday 3. Hypertension/volume - Net UF 1.5 Post wt 75.8 4/9 and net UF 1.5 Sat with post wt 74.2 .Has been leaving under EDW at OP clinic. Lower EDW on DC.On midodrine 10 mg PO T,Th,S prior to HD. Low dose carvedilol.  4. Anemia -hgb 6.5 4/11 s/p 1 unit 4/9 and another unit 4/11 - hgb up 9.2 yesterday and stable at 9.4 4/12 .^ Arnaesp to 100 next dose- weekly Fe - had one dose Thursday -reorder if still here otherwise continue at outpatient unit  5. Metabolic bone disease -Ca/P ok. Continue renvela binders, VDRA.Parsabiv not on hospital formulary. 6. Nutrition - Albumin 2.5 Renal diet with fld restrictions, prostat, renal vits. 7. SLE-per  primary. On Plaquenil. 8. PAD-S/P R toe amp. Per primary. 9. H/O CHB-has PPM. 10. Thrombocytopenia - plts declining but trending back up off heparin watch 11.  Tachycardia-may need PPM interrogated. Per primary.  12. Disp - to return to previous living facility today.   Rita H. Brown NP-C 09/19/2018, 11:18 AM  [facility] [phone]  Pt seen, examined and agree w A/P as above.  [facility] 09/19/2018, 2:19 PM

## 2018-09-19 NOTE — Progress Notes (Signed)
Paged by nursing staff about asymptomatic v.tach on telemetry. Evaluated patient at bedside with senior resident present. She was anxious but able to answer questions. States she wants to get on the bed. Denies any chest pain, palpitations, dyspnea, light-headedness. Hx of sundowning per family. EKG shows ventricular paced rhythm with HR ~125. No new hematochezia overnight.  Vitals:   09/18/18 2107 09/19/18 0310  BP: 128/71 122/80  Pulse: 98 (!) 124  Resp:    Temp: 99.1 F (37.3 C) 98.2 F (36.8 C)  SpO2: 95% 100%   Gen: Well-developed, well nourished, Agitated CV: Tachycardic, S1, S2 normal, No rubs, no murmurs, no gallops Pulm: CTAB, No rales, no wheezes, no dullness to percussion  Extm: ROM intact, Peripheral pulses intact, No peripheral edema Skin: Dry, Warm Neuro: AAOx1 to self  Sustained V.Tach - Tachycardia resolved without intervention after ~60 minutes - Hx of intermittent non-sustained v.tach per chart review - CBC, Renal panel, magnesium - Restart home rate control: carvedilol 6.25mg  BID - C/w telemetry - Consider EP consult inpatient or EP visit closely after discharge

## 2018-09-19 NOTE — TOC Initial Note (Signed)
Transition of Care [facility]) - Initial/Assessment Note    Patient Details  Name: Abigail Buck MRN: [id number] Date of Birth: [birth year]  [facility]) CM/SW Contact:    Benard Halsted, LCSW Phone Number: 09/19/2018, 10:52 AM  Clinical Narrative:                 73yo F w. Hx ofESRD (HD TuThS), SLE, HTN,paroxysmal tachycardia, complete heart block (s/ppacemaker), CVA (on aspirin and Plavix;residual left lower extremity weakness,dysarthria),anddementia who presented to [facility] emergency department.  CSW received consult for possible SNF placement at time of discharge. CSW spoke with patient's daughter, Margreta Journey, regarding discharge plan. Patient has resided at [facility] and will return at discharge. Patient expressed being hopeful for return and to feel better soon. No further questions reported at this time. Patient will require PTAR for transport. CSW to continue to follow and assist with discharge planning needs.   Expected Discharge Plan: Skilled Nursing Facility Barriers to Discharge: No Barriers Identified   Patient Goals and CMS Choice Patient states their goals for this hospitalization and ongoing recovery are:: Return to SNF CMS Medicare.gov Compare Post Acute Care list provided to:: Patient Choice offered to / list presented to : Adult Children  Expected Discharge Plan and Services Expected Discharge Plan: [place] In-house Referral: Clinical Social Work Discharge Planning Services: NA Post Acute Care Choice: [place] Living arrangements for the past 2 months: [place] Expected Discharge Date: 09/19/18               DME Arranged: N/A DME Agency: NA HH Arranged: NA [place] Agency: NA  Prior Living Arrangements/Services Living arrangements for the past 2 months: [place] Lives with:: Facility Resident Patient language and need for interpreter reviewed:: Yes Do you feel safe  going back to the place where you live?: Yes      Need for Family Participation in Patient Care: Yes (Comment) Care giver support system in place?: Yes (comment)   Criminal Activity/Legal Involvement Pertinent to Current Situation/Hospitalization: No - Comment as needed  Activities of Daily Living Home Assistive Devices/Equipment: Wheelchair, Shower chair with back ADL Screening (condition at time of admission) Patient's cognitive ability adequate to safely complete daily activities?: No Is the patient deaf or have difficulty hearing?: No Does the patient have difficulty seeing, even when wearing glasses/contacts?: No Does the patient have difficulty concentrating, remembering, or making decisions?: Yes Patient able to express need for assistance with ADLs?: Yes Does the patient have difficulty dressing or bathing?: Yes Independently performs ADLs?: No Communication: Independent Dressing (OT): Needs assistance, Dependent Is this a change from baseline?: Pre-admission baseline Grooming: Needs assistance, Dependent Is this a change from baseline?: Pre-admission baseline Feeding: Independent Bathing: Needs assistance, Dependent Is this a change from baseline?: Pre-admission baseline Toileting: Needs assistance, Dependent Is this a change from baseline?: Pre-admission baseline In/Out Bed: Needs assistance, Dependent Is this a change from baseline?: Pre-admission baseline Walks in Home: Dependent Is this a change from baseline?: Pre-admission baseline Does the patient have difficulty walking or climbing stairs?: Yes Weakness of Legs: Both Weakness of Arms/Hands: Both  Permission Sought/Granted Permission sought to share information with : Facility Sport and exercise psychologist, Family Supports Permission granted to share information with : Yes, Verbal Permission Granted  Share Information with NAME: Margreta Journey  Permission granted to share info w AGENCY: [place]  Permission granted to  share info w Relationship: Daughter  Permission granted to share info w Contact Information: [phone]  Emotional Assessment Appearance:: Appears stated age Attitude/Demeanor/Rapport: Gracious Affect (typically observed): Accepting, Appropriate Orientation: : Oriented to Self, Oriented to Place, Oriented to  Time, Oriented to Situation Alcohol / Substance Use: Not Applicable Psych Involvement: No (comment)  Admission diagnosis:  Lower GI bleed [K92.2] Right knee pain [M25.561] Patient Active Problem List   Diagnosis Date Noted  . Diverticulosis of colon without hemorrhage   . Lower GI bleed   . Hematochezia   . Acute blood loss anemia   . Platelet inhibition due to Plavix   . Acute GI bleeding 09/15/2018  . ESRD (end stage renal disease) ([place]) 08/08/2018  . History of cerebral infarction   . Chest pain 06/21/2018  . Lupus ([place])   . Elevated troponin   . Artificial pacemaker   . Right bundle branch block   . ESRD (end stage renal disease) on dialysis ([place]) 03/12/2017   PCP:  System, Pcp Not In Pharmacy:  No Pharmacies Listed    Social Determinants of Health (SDOH) Interventions    Readmission Risk Interventions Readmission Risk Prevention Plan 09/19/2018  Transportation Screening Complete  PCP or Specialist Appt within 5-7 Days Complete  Home Care Screening Complete  Medication Review (RN CM) Complete

## 2018-09-19 NOTE — Progress Notes (Signed)
Nsg Discharge Note  Admit Date:  09/15/2018 Discharge date: 09/19/2018   Abigail Buck to be D/C'd Skilled nursing facility per MD order.   Patient/caregiver able to verbalize understanding.  Discharge Medication: Allergies as of 09/19/2018      Reactions   Morphine Other (See Comments)   Opposite effect makes her agressive   Clindamycin/lincomycin    Listed on MAR      Medication List    STOP taking these medications   aspirin EC 81 MG tablet     TAKE these medications   acetaminophen 325 MG tablet Commonly known as:  TYLENOL Take 650 mg by mouth every 4 (four) hours as needed (general discomfort).   ALPRAZolam 0.25 MG tablet Commonly known as:  XANAX Take 0.25 mg by mouth 2 (two) times daily.   carvedilol 6.25 MG tablet Commonly known as:  COREG Take 6.25 mg by mouth 2 (two) times daily with a meal.   clopidogrel 75 MG tablet Commonly known as:  PLAVIX Take 75 mg by mouth daily.   ergocalciferol 1.25 MG (50000 UT) capsule Commonly known as:  VITAMIN D2 Take 50,000 Units by mouth every Friday.   hydroxychloroquine 200 MG tablet Commonly known as:  PLAQUENIL Take 200 mg by mouth daily at 12 noon.   midodrine 10 MG tablet Commonly known as:  PROAMATINE Take 10 mg by mouth daily at 12 noon.   oxyCODONE 5 MG immediate release tablet Commonly known as:  Oxy IR/ROXICODONE Take 1-2 tablets (5-10 mg total) by mouth every 4 (four) hours as needed for moderate pain.   sevelamer carbonate 800 MG tablet Commonly known as:  RENVELA Take 800 mg by mouth 3 (three) times daily with meals.       Discharge Assessment: Vitals:   09/19/18 0310 09/19/18 0507  BP: 122/80 140/82  Pulse: (!) 124 88  Resp:    Temp: 98.2 F (36.8 C) 98.4 F (36.9 C)  SpO2: 100% 96%   Skin clean, dry and intact without evidence of skin break down, no evidence of skin tears noted. IV catheter discontinued intact. Site without signs and symptoms of complications - no redness or edema noted at  insertion site, patient denies c/o pain - only slight tenderness at site.  Dressing with slight pressure applied.  D/c Instructions-Education: Discharge instructions given to patient/family with verbalized understanding. D/c education completed with patient/family including follow up instructions, medication list, d/c activities limitations if indicated, with other d/c instructions as indicated by MD - patient able to verbalize understanding, all questions fully answered. Patient instructed to return to ED, call 911, or call MD for any changes in condition.  Patient escorted via stretcher and ptar ambulance services.  Hiram Comber, RN 09/19/2018 1:55 PM

## 2018-09-19 NOTE — Progress Notes (Signed)
  Date: 09/19/2018  Patient name: Abigail Buck  Medical record number: [id number]  Date of birth: [birth year]   I have seen and evaluated this patient and I have discussed the plan of care with the house staff. Please see Dr. Janne Napoleon note for complete details. I concur with her findings and plan.     Sid Falcon, MD 09/19/2018, 3:34 PM

## 2018-09-19 NOTE — [place] FL2 (Signed)
[place] MEDICAID FL2 LEVEL OF CARE SCREENING TOOL     IDENTIFICATION  Patient Name: Abigail Buck Birthdate: [birth year] Sex: female Admission Date (Current Location): 09/15/2018  [facility] [place] Number:  Herbalist and Address:  [facility], [place] [address], [place], [place] [address]      Provider Number: [id number]  Attending Physician Name and Address:  Sid Falcon, MD  Relative Name and Phone Number:  Margreta Journey, daughter, [phone]    Current Level of Care: Hospital Recommended Level of Care: [place] Prior Approval Number:    Date Approved/Denied:   PASRR Number: [id number]  Discharge Plan: SNF    Current Diagnoses: Patient Active Problem List   Diagnosis Date Noted  . Diverticulosis of colon without hemorrhage   . Lower GI bleed   . Hematochezia   . Acute blood loss anemia   . Platelet inhibition due to Plavix   . Acute GI bleeding 09/15/2018  . ESRD (end stage renal disease) ([place]) 08/08/2018  . History of cerebral infarction   . Chest pain 06/21/2018  . Lupus ([place])   . Elevated troponin   . Artificial pacemaker   . Right bundle branch block   . ESRD (end stage renal disease) on dialysis ([place]) 03/12/2017    Orientation RESPIRATION BLADDER Height & Weight     Self, Time, Situation, Place(Some confusion)  Normal Continent Weight: 163 lb 9.3 oz (74.2 kg) Height:  5\' 6"  (167.6 cm)  BEHAVIORAL SYMPTOMS/MOOD NEUROLOGICAL BOWEL NUTRITION STATUS      Incontinent Diet(Please see DC Summary)  AMBULATORY STATUS COMMUNICATION OF NEEDS Skin   Extensive Assist Verbally Surgical wounds(Closed incision on toe)                       Personal Care Assistance Level of Assistance  Bathing, Feeding, Dressing Bathing Assistance: Maximum assistance Feeding assistance: Limited assistance Dressing Assistance: Limited assistance     Functional Limitations Info  Sight, Hearing, Speech Sight Info:  Adequate Hearing Info: Impaired Speech Info: Adequate    SPECIAL CARE FACTORS FREQUENCY  PT (By licensed PT)     PT Frequency: 3x/week              Contractures Contractures Info: Not present    Additional Factors Info  Code Status, Allergies Code Status Info: DNR Allergies Info: Morphine, Clindamycin/lincomycin           Current Medications (09/19/2018):  This is the current hospital active medication list Current Facility-Administered Medications  Medication Dose Route Frequency Provider Last Rate Last Dose  . 0.9 %  sodium chloride infusion (Manually program via Guardrails IV Fluids)   Intravenous Once Irene Shipper, MD      . acetaminophen (TYLENOL) tablet 650 mg  650 mg Oral Q6H PRN Irene Shipper, MD       Or  . acetaminophen (TYLENOL) suppository 650 mg  650 mg Rectal Q6H PRN Irene Shipper, MD      . carvedilol (COREG) tablet 6.25 mg  6.25 mg Oral BID WC Mosetta Anis, MD   6.25 mg at 09/19/18 0352  . chlorhexidine (PERIDEX) 0.12 % solution 15 mL  15 mL Mouth Rinse BID Irene Shipper, MD   15 mL at 09/19/18 0800  . Chlorhexidine Gluconate Cloth 2 % PADS 6 each  6 each Topical Q0600 Irene Shipper, MD   6 each at 09/19/18 [phone]  . clopidogrel (PLAVIX) tablet 75 mg  75 mg Oral Daily Irene Shipper, MD   75 mg at 09/19/18 0800  . [START ON 09/22/2018] Darbepoetin Alfa (ARANESP) injection 100 mcg  100 mcg Intravenous Q Thu-HD Irene Shipper, MD      . doxercalciferol (HECTOROL) injection 6 mcg  6 mcg Intravenous Q T,Th,Sa-HD Irene Shipper, MD   6 mcg at 09/17/18 0940  . hydroxychloroquine (PLAQUENIL) tablet 200 mg  200 mg Oral Q1200 Irene Shipper, MD   200 mg at 09/19/18 1105  . MEDLINE mouth rinse  15 mL Mouth Rinse q12n4p Irene Shipper, MD   15 mL at 09/19/18 1106  . midodrine (PROAMATINE) tablet 10 mg  10 mg Oral Q T,Th,Sa-HD Irene Shipper, MD   10 mg at 09/17/18 0941  . oxyCODONE (Oxy IR/ROXICODONE) immediate release tablet 5 mg  5 mg Oral Q6H PRN Irene Shipper, MD      .  sevelamer carbonate (RENVELA) tablet 800 mg  800 mg Oral TID WC Irene Shipper, MD   800 mg at 09/19/18 1105     Discharge Medications: Please see discharge summary for a list of discharge medications.  Relevant Imaging Results:  Relevant Lab Results:   Additional Information SS#: [id number]. HD TTS.  Benard Halsted, LCSW

## 2018-09-19 NOTE — TOC Transition Note (Signed)
Transition of Care [facility]) - CM/SW Discharge Note   Patient Details  Name: Abigail Buck MRN: [id number] Date of Birth: 1945/11/08  Transition of Care [facility]) CM/SW Contact:  Benard Halsted, LCSW Phone Number: 09/19/2018, 11:20 AM   Clinical Narrative:    Patient will DC to: [facility] Anticipated DC date: 09/19/2018 Family notified: Daughter, Buyer, retail by: Corey Harold 12:30pm   Per MD patient ready for DC to [facility]. RN, patient, patient's family, and facility notified of DC. Discharge Summary and FL2 sent to facility. RN to call report prior to discharge ([phone]). DC packet on chart. Ambulance transport requested for patient.   CSW will sign off for now as social work intervention is no longer needed. Please consult [place] again if new needs arise.  Cedric Fishman, LCSW Clinical Social Worker [phone]    Final next level of care: Skilled Nursing Facility Barriers to Discharge: No Barriers Identified   Patient Goals and CMS Choice Patient states their goals for this hospitalization and ongoing recovery are:: Return to SNF CMS Medicare.gov Compare Post Acute Care list provided to:: Patient Choice offered to / list presented to : Adult Children  Discharge Placement   Existing PASRR number confirmed : 09/19/18          Patient chooses bed at: Other - please specify in the comment section below:([facility]) Patient to be transferred to facility by: [place] Name of family member notified: Margreta Journey, Daughter Patient and family notified of of transfer: 09/19/18  Discharge Plan and Services In-house Referral: Clinical Social Work Discharge Planning Services: NA Post Acute Care Choice: [place]          DME Arranged: N/A DME Agency: NA HH Arranged: NA HH Agency: NA   Social Determinants of Health ([place]) Interventions     Readmission Risk Interventions Readmission Risk Prevention Plan 09/19/2018  Transportation Screening Complete  PCP or  Specialist Appt within 5-7 Days Complete  Home Care Screening Complete  Medication Review (RN CM) Complete

## 2018-09-30 ENCOUNTER — Emergency Department (HOSPITAL_COMMUNITY): Payer: Medicare Other

## 2018-09-30 ENCOUNTER — Emergency Department (HOSPITAL_COMMUNITY)
Admission: EM | Admit: 2018-09-30 | Discharge: 2018-09-30 | Disposition: A | Discharge status: Home / Self Care | Payer: Medicare Other | Attending: Emergency Medicine | Admitting: Emergency Medicine

## 2018-09-30 ENCOUNTER — Encounter (HOSPITAL_COMMUNITY): Payer: Self-pay

## 2018-09-30 ENCOUNTER — Other Ambulatory Visit: Payer: Self-pay

## 2018-09-30 DIAGNOSIS — R509 Fever, unspecified: Secondary | ICD-10-CM | POA: Insufficient documentation

## 2018-09-30 DIAGNOSIS — Z992 Dependence on renal dialysis: Secondary | ICD-10-CM | POA: Insufficient documentation

## 2018-09-30 DIAGNOSIS — Z7901 Long term (current) use of anticoagulants: Secondary | ICD-10-CM | POA: Diagnosis not present

## 2018-09-30 DIAGNOSIS — M3210 Systemic lupus erythematosus, organ or system involvement unspecified: Secondary | ICD-10-CM | POA: Diagnosis not present

## 2018-09-30 DIAGNOSIS — F0390 Unspecified dementia without behavioral disturbance: Secondary | ICD-10-CM | POA: Insufficient documentation

## 2018-09-30 DIAGNOSIS — Z79899 Other long term (current) drug therapy: Secondary | ICD-10-CM | POA: Diagnosis not present

## 2018-09-30 DIAGNOSIS — N186 End stage renal disease: Secondary | ICD-10-CM | POA: Insufficient documentation

## 2018-09-30 DIAGNOSIS — R05 Cough: Secondary | ICD-10-CM | POA: Diagnosis not present

## 2018-09-30 DIAGNOSIS — Z20828 Contact with and (suspected) exposure to other viral communicable diseases: Secondary | ICD-10-CM | POA: Diagnosis not present

## 2018-09-30 DIAGNOSIS — R059 Cough, unspecified: Secondary | ICD-10-CM

## 2018-09-30 DIAGNOSIS — Z950 Presence of cardiac pacemaker: Secondary | ICD-10-CM | POA: Diagnosis not present

## 2018-09-30 DIAGNOSIS — Z9889 Other specified postprocedural states: Secondary | ICD-10-CM | POA: Insufficient documentation

## 2018-09-30 LAB — COMPREHENSIVE METABOLIC PANEL
ALT: 10 U/L (ref 0–44)
AST: 16 U/L (ref 15–41)
Albumin: 3.3 g/dL — ABNORMAL LOW (ref 3.5–5.0)
Alkaline Phosphatase: 68 U/L (ref 38–126)
Anion gap: 13 (ref 5–15)
BUN: 18 mg/dL (ref 8–23)
CO2: 32 mmol/L (ref 22–32)
Calcium: 9.5 mg/dL (ref 8.9–10.3)
Chloride: 99 mmol/L (ref 98–111)
Creatinine, Ser: 8.56 mg/dL — ABNORMAL HIGH (ref 0.44–1.00)
GFR calc Af Amer: 5 mL/min — ABNORMAL LOW (ref >60)
GFR calc non Af Amer: 4 mL/min — ABNORMAL LOW (ref >60)
Glucose, Bld: 94 mg/dL (ref 70–99)
Potassium: 3.5 mmol/L (ref 3.5–5.1)
Sodium: 144 mmol/L (ref 135–145)
Total Bilirubin: 0.7 mg/dL (ref 0.3–1.2)
Total Protein: 6.9 g/dL (ref 6.5–8.1)

## 2018-09-30 LAB — CBC WITH DIFFERENTIAL/PLATELET
Abs Immature Granulocytes: 0.03 10*3/uL (ref 0.00–0.07)
Basophils Absolute: 0 10*3/uL (ref 0.0–0.1)
Basophils Relative: 1 %
Eosinophils Absolute: 0.1 10*3/uL (ref 0.0–0.5)
Eosinophils Relative: 2 %
HCT: 32.9 % — ABNORMAL LOW (ref 36.0–46.0)
Hemoglobin: 10.2 g/dL — ABNORMAL LOW (ref 12.0–15.0)
Immature Granulocytes: 1 %
Lymphocytes Relative: 19 %
Lymphs Abs: 1.1 10*3/uL (ref 0.7–4.0)
MCH: 28.8 pg (ref 26.0–34.0)
MCHC: 31 g/dL (ref 30.0–36.0)
MCV: 92.9 fL (ref 80.0–100.0)
Monocytes Absolute: 0.5 10*3/uL (ref 0.1–1.0)
Monocytes Relative: 8 %
Neutro Abs: 4.3 10*3/uL (ref 1.7–7.7)
Neutrophils Relative %: 69 %
Platelets: 121 10*3/uL — ABNORMAL LOW (ref 150–400)
RBC: 3.54 MIL/uL — ABNORMAL LOW (ref 3.87–5.11)
RDW: 14.1 % (ref 11.5–15.5)
WBC: 6.1 10*3/uL (ref 4.0–10.5)
nRBC: 0 % (ref 0.0–0.2)

## 2018-09-30 LAB — TROPONIN I: Troponin I: 0.07 ng/mL (ref <0.03)

## 2018-09-30 LAB — LACTATE DEHYDROGENASE: LDH: 145 U/L (ref 98–192)

## 2018-09-30 LAB — TRIGLYCERIDES: Triglycerides: 101 mg/dL (ref <150)

## 2018-09-30 LAB — LACTIC ACID, PLASMA: Lactic Acid, Venous: 1.6 mmol/L (ref 0.5–1.9)

## 2018-09-30 LAB — SARS CORONAVIRUS 2 BY RT PCR (HOSPITAL ORDER, PERFORMED IN ~~LOC~~ HOSPITAL LAB): SARS Coronavirus 2: NEGATIVE

## 2018-09-30 LAB — PROCALCITONIN: Procalcitonin: 0.63 ng/mL

## 2018-09-30 NOTE — ED Notes (Signed)
Family at bedside. 

## 2018-09-30 NOTE — ED Provider Notes (Signed)
[facility] EMERGENCY DEPARTMENT Provider Note   CSN: [id number] Arrival date & time: 09/30/18  1210  History   Chief Complaint Chief Complaint  Patient presents with  . Fever    HPI Abigail Buck is a 73 y.o. female with past medical history significant  ESRD (T/R/S), pacemaker, RBBB, CVA, left chest permacath who presents for evaluation of fever and cough. Intermittent fever at [facility] rehab center. No temp for the last 24 hours. Temp max 101.0. Non productive cough and SOB x 24 hours. Does not use home oxygen, requiring 2 L with EMS. Oxygen at 85% on RA for EMS. Intermittent CP with Cough. Denies chills, headache, unilateral weakness, neck pain, neck stiffness, abdominal pain, diarrhea, dysuria, constipation, melena, hematochezia. Wheelchair bound at baseline.  At Baseline Neuro per facility.  Level 5 Caveat DEMENTIA  Patient is a DNI/DNR. Confirmed with family.    HPI  Past Medical History:  Diagnosis Date  . Anemia 07/2018   Transfused 2 PRBCs.  . ESRD (end stage renal disease) (HCC)    TTS HD  . Lupus [facility])     Patient Active Problem List   Diagnosis Date Noted  . Diverticulosis of colon without hemorrhage   . Lower GI bleed   . Hematochezia   . Acute blood loss anemia   . Platelet inhibition due to Plavix   . Acute GI bleeding 09/15/2018  . ESRD (end stage renal disease) ([place]) 08/08/2018  . History of cerebral infarction   . Chest pain 06/21/2018  . Lupus ([place])   . Elevated troponin   . Artificial pacemaker   . Right bundle branch block   . ESRD (end stage renal disease) on dialysis ([place]) 03/12/2017    Past Surgical History:  Procedure Laterality Date  . ABDOMINAL AORTOGRAM W/LOWER EXTREMITY Bilateral 08/08/2018   Procedure: ABDOMINAL AORTOGRAM W/LOWER EXTREMITY;  Surgeon: [name], MD;  Location: [facility] CV LAB;  Service: Cardiovascular;  Laterality: Bilateral;  . ABDOMINAL HYSTERECTOMY  1986  . AMPUTATION Right  08/09/2018   Procedure: AMPUTATION RIGHT GREAT TOE;  Surgeon: Angelia Mould, MD;  Location: [facility];  Service: Vascular;  Laterality: Right;  . COLONOSCOPY N/A 09/18/2018   Procedure: COLONOSCOPY;  Surgeon: Irene Shipper, MD;  Location: [facility] ENDOSCOPY;  Service: Endoscopy;  Laterality: N/A;  . PACEMAKER IMPLANT  10/08/2017   Done at [facility]     OB History   No obstetric history on file.      Home Medications    Prior to Admission medications   Medication Sig Start Date End Date Taking? Authorizing Provider  acetaminophen (TYLENOL) 325 MG tablet Take 650 mg by mouth every 4 (four) hours as needed (for pain).    Yes [provider]  ALPRAZolam (XANAX) 0.25 MG tablet Take 0.25 mg by mouth 2 (two) times daily.  10/14/17  Yes [provider]  carvedilol (COREG) 6.25 MG tablet Take 6.25 mg by mouth 2 (two) times daily with a meal.   Yes [provider]  clopidogrel (PLAVIX) 75 MG tablet Take 75 mg by mouth daily.   Yes [provider]  ergocalciferol (VITAMIN D2) 50000 units capsule Take 50,000 Units by mouth every Friday.    Yes [provider]  hydroxychloroquine (PLAQUENIL) 200 MG tablet Take 200 mg by mouth daily at 12 noon.    Yes [provider]  midodrine (PROAMATINE) 10 MG tablet Take 10 mg by mouth daily at 12 noon.    Yes [provider]  oxyCODONE (OXY IR/ROXICODONE) 5 MG immediate release tablet Take 1-2 tablets (5-10 mg total) by mouth every 4 (four) hours as needed for moderate pain. Patient taking differently: Take 5 mg by mouth every 4 (four) hours as needed for moderate pain.  08/10/18  Yes Dagoberto Ligas, PA-C  sevelamer carbonate (RENVELA) 800 MG tablet Take 800 mg by mouth 3 (three) times daily with meals.   Yes [provider]    Family History Family History  Problem Relation Age of Onset  . Stroke Mother   . Hypertension Mother   . Heart murmur Father     Social History Social History    Tobacco Use  . Smoking status: Never Smoker  . Smokeless tobacco: Never Used  Substance Use Topics  . Alcohol use: No  . Drug use: No     Allergies   Morphine and Clindamycin/lincomycin   Review of Systems Review of Systems  Constitutional: Positive for fever. Negative for activity change and chills.  HENT: Negative.   Respiratory: Positive for cough and shortness of breath. Negative for apnea, choking, chest tightness, wheezing and stridor.   Cardiovascular: Positive for chest pain. Negative for palpitations and leg swelling.  Gastrointestinal: Negative.   Genitourinary: Negative.   Musculoskeletal: Negative.   Skin: Negative.   Neurological: Negative.   All other systems reviewed and are negative.  Physical Exam Updated Vital Signs BP (!) 143/67   Pulse 60   Temp 98.7 F (37.1 C) (Rectal)   Resp 20   SpO2 100%   Physical Exam Vitals signs and nursing note reviewed.  Constitutional:      General: She is not in acute distress.    Appearance: She is well-developed. She is not ill-appearing, toxic-appearing or diaphoretic.  HENT:     Head: Normocephalic and atraumatic.     Nose: Nose normal.     Mouth/Throat:     Comments: Mucous membranes mildly dry. Eyes:     Pupils: Pupils are equal, round, and reactive to light.  Neck:     Musculoskeletal: Normal range of motion.     Comments: No neck stiffness or neck rigidity. No meningismus. Cardiovascular:     Rate and Rhythm: Normal rate.     Pulses: Normal pulses.          Dorsalis pedis pulses are 2+ on the right side and 2+ on the left side.       Posterior tibial pulses are 2+ on the right side and 2+ on the left side.     Heart sounds: Normal heart sounds. No murmur. No friction rub. No gallop.   Pulmonary:     Effort: No respiratory distress.     Comments: Clear to ascultation bilaterally without wheeze, rhonchi or rales. Speaks in full sentences without difficulty. tachypnic at 26 Chest:    Abdominal:      General: There is no distension.     Comments: Soft non tender without rebound or guarding. Normoactive bowel sounds. No overlying skin changes.  Musculoskeletal: Normal range of motion.     Comments: Moves all 4 extremities without difficulty. No lower extremity edema, erythema, ecchymosis or warmth. Right great toe amputation without overlying skin changes.  Feet:     Comments: Right great toe amputation. Skin:    General: Skin is warm and dry.     Comments: Ashin skin. No rashes or lesions. Brisk cap refill. Patient refused to roll completely to asses back for rashes or lesions.  Neurological:  Mental Status: She is alert.    ED Treatments / Results  Labs (all labs ordered are listed, but only abnormal results are displayed) Labs Reviewed  CBC WITH DIFFERENTIAL/PLATELET - Abnormal; Notable for the following components:      Result Value   RBC 3.54 (*)    Hemoglobin 10.2 (*)    HCT 32.9 (*)    Platelets 121 (*)    All other components within normal limits  COMPREHENSIVE METABOLIC PANEL - Abnormal; Notable for the following components:   Creatinine, Ser 8.56 (*)    Albumin 3.3 (*)    GFR calc non Af Amer 4 (*)    GFR calc Af Amer 5 (*)    All other components within normal limits  TROPONIN I - Abnormal; Notable for the following components:   Troponin I 0.07 (*)    All other components within normal limits  SARS CORONAVIRUS 2 (HOSPITAL ORDER, [place] [facility])  CULTURE, BLOOD (ROUTINE X 2)  CULTURE, BLOOD (ROUTINE X 2)  LACTIC ACID, PLASMA  PROCALCITONIN  LACTATE DEHYDROGENASE  TRIGLYCERIDES    EKG EKG Interpretation  Date/Time:  Friday September 30 2018 12:19:21 EDT Ventricular Rate:  66 PR Interval:    QRS Duration: 150 QT Interval:  466 QTC Calculation: 489 R Axis:   -78 Text Interpretation:  Sinus rhythm Prolonged PR interval Right bundle branch block LVH with IVCD and secondary repol abnrm Borderline prolonged QT interval Confirmed by  Davonna Belling [phone]) on 09/30/2018 1:30:49 PM   Radiology Dg Chest Port 1 View  Result Date: 09/30/2018 CLINICAL DATA:  Fever EXAM: PORTABLE CHEST 1 VIEW COMPARISON:  June 21, 2018. FINDINGS: Central catheter tip is in the right atrium. No pneumothorax. There is no edema or consolidation. Heart is borderline enlarged with pulmonary vascularity normal. Pacemaker leads are attached to the right atrium and right ventricle. There are multiple calcified lymph nodes consistent with prior granulomatous disease. No enlarged lymph nodes evident. There is aortic atherosclerosis. No bone lesions. IMPRESSION: Central catheter tip in right atrium. No edema or consolidation. Stable cardiac prominence. Stable pacemaker lead placement. Aortic Atherosclerosis (ICD10-I70.0). Electronically Signed   By: Lowella Grip III M.D.   On: 09/30/2018 14:14   Dg Foot Complete Right  Result Date: 09/30/2018 CLINICAL DATA:  Intermittent fevers.  Recent great toe amputation. EXAM: RIGHT FOOT COMPLETE - 3+ VIEW COMPARISON:  None. FINDINGS: Postsurgical changes related to prior great toe amputation to the level of the mid first proximal phalanx. Mild soft tissue irregularity at the amputation stump. No acute fracture or dislocation. No osseous destruction or periosteal reaction. Joint spaces are preserved. Diffuse osteopenia. Vascular calcifications. IMPRESSION: 1. Prior great toe amputation with mild soft tissue irregularity at the amputation stump. Correlate with physical exam. 2.  No acute osseous abnormality. Electronically Signed   By: Titus Dubin M.D.   On: 09/30/2018 14:17    Procedures Procedures (including critical care time)  Medications Ordered in ED Medications - No data to display   Initial Impression / Assessment and Plan / ED Course  I have reviewed the triage vital signs and the nursing notes.  Pertinent labs & imaging results that were available during my care of the patient were reviewed by me  and considered in my medical decision making (see chart for details).  73 year old patient with dementia at baseline neurologically presents for fever, cough and SOB. Afebrile here including rectal temp. Tacypnic and hypoxic requiring 2L [place]. No oxygen at baseline.  Lungs clear without wheeze, rhonchi or rales. Heart without murmur rubs or gallops. Lower extremity with great toe amputation to right foot. No skin changes to indicate cellulitis. Patient no longer makes urine. No neck stiffness or rigidity. Low suspicion for meningitis. No evidence of cellulitis however patient refused to roll to back for  assessment. Wheelchair bound at baseline. No known COVID exposures. Will get COVID labs and sepsis labs and reevaluate. Last dialysis yesterday, full session. T/R/S. Low suspicion for sepsis or bacteremia.  Troponin elevated at 0.07, previous elevated, related to ESRD or demand ischemia? EKG at baseline without new ST/T changes. No STEMI. CBC without leukocytosis, hemoglobin consistent with ESRD at baseline. Creatinine 8.56, previous 7.02-- Acute on chronic vs worsening CKD vs needs dialysis? Plain film right foot without osteomyelitis. DG Chest negative for infiltrates, pulmonary edema or pneumothorax or effusion. COVID testing pending. Doubt sepsis as lactic acid 1.6. Afebrile rectal, no tachycardia. Lungs clear. Appears overall well. No skin change to indicate cellulitis. NO neck stiffness or rigidity to indicate meningitis. Ears clear.  Patient is a DNR/DNI. Confirmed this with patient daughter's who are Medical POA.  Care transferred to [facility] who will determine ultimate treatment, plan and disposition. Labs pending at care transfer. Could possibly trial off O2 and reevaluate vs admit for new onset hypoxia. Does not produce urine to obtain sample. Non ambulatory at baseline. Low suspicion for bacteremia or bacterial infectious process. Possible viral? Clinical Course as of Oct 01 1502  Fri Sep 30, 2018   1812 Troponin I(!!): 0.07 [LS]    Clinical Course User Index [LS] Fransico Meadow, [place]   Final Clinical Impressions(s) / ED Diagnoses   Final diagnoses:  Fever, unspecified fever cause  Cough    ED Discharge Orders    None       Areliz Rothman A, PA-C 10/01/18 1504    Davonna Belling, MD 10/03/18 1537

## 2018-09-30 NOTE — ED Notes (Signed)
PTAR called @ 1841-per Paige, RN-called by Levada Dy

## 2018-09-30 NOTE — ED Provider Notes (Signed)
Pt's 02 sat 100% when not on 02.  Covid testing is negative.  Pt looks well.  Pt talking in full sentences.  Pt not short of breath.  Pt is scheduled for dialysis tomorrow.  Pt appears stable for discharge.    Abigail Buck, [place] 09/30/18 1829    Tegeler, Gwenyth Allegra, MD 09/30/18 [phone]

## 2018-09-30 NOTE — ED Notes (Signed)
Pt daughter updated with patient's permission. Phone number given to daughter.

## 2018-09-30 NOTE — ED Notes (Signed)
Patient verbalizes understanding of discharge instructions. Opportunity for questioning and answers were provided. Armband removed by staff, pt discharged from ED by PTAR. Attempted to give report to facility but there was no answer. Number left for facility RN to call this RN back

## 2018-09-30 NOTE — ED Notes (Signed)
Called EDMD to see if he still wanted second lactic and he said no.

## 2018-09-30 NOTE — ED Notes (Signed)
Nurse Navigator checked with pt.  Pt is aware that she is being discharged and waiting on ambulance.  Pt states that her daughter is updated.  Pt denies any needs. No action taken at this time

## 2018-09-30 NOTE — ED Provider Notes (Signed)
Covid negative,   Pt has new 02 requirement.  Sats without 02 low 80's  Hospitalist consulted   Sidney Ace 09/30/18 1803    Tegeler, Gwenyth Allegra, MD 09/30/18 [phone]

## 2018-09-30 NOTE — ED Notes (Signed)
I stuck pt and was unsuccessful

## 2018-09-30 NOTE — Discharge Instructions (Addendum)
Return if any problems.

## 2018-09-30 NOTE — ED Triage Notes (Signed)
Pt has had intermittent fevers the past few days (per EMS) as reported by [place] center. Arrived to ED afebrile, (98.7-rectally).

## 2018-09-30 NOTE — ED Notes (Signed)
Nurse Navigator communication with daughter of the patient Altha Harm, calling for updates about care and to see if she would be admitted. Information given about xray, lab work and the need to swab for Covid-19. She agrees to this care. FaceTime was provided so that she could see her mother and speak with her. She was very Patent attorney.

## 2018-10-05 LAB — CULTURE, BLOOD (ROUTINE X 2)
Culture: NO GROWTH
Culture: NO GROWTH
Special Requests: ADEQUATE
Special Requests: ADEQUATE

## 2018-10-24 ENCOUNTER — Emergency Department (HOSPITAL_COMMUNITY): Payer: Medicare Other

## 2018-10-24 ENCOUNTER — Encounter (HOSPITAL_COMMUNITY): Payer: Self-pay

## 2018-10-24 ENCOUNTER — Emergency Department (HOSPITAL_COMMUNITY)
Admission: EM | Admit: 2018-10-24 | Discharge: 2018-10-24 | Disposition: A | Discharge status: Home / Self Care | Payer: Medicare Other | Attending: Emergency Medicine | Admitting: Emergency Medicine

## 2018-10-24 ENCOUNTER — Other Ambulatory Visit: Payer: Self-pay

## 2018-10-24 DIAGNOSIS — Z950 Presence of cardiac pacemaker: Secondary | ICD-10-CM | POA: Diagnosis not present

## 2018-10-24 DIAGNOSIS — Z992 Dependence on renal dialysis: Secondary | ICD-10-CM | POA: Diagnosis not present

## 2018-10-24 DIAGNOSIS — Z79899 Other long term (current) drug therapy: Secondary | ICD-10-CM | POA: Diagnosis not present

## 2018-10-24 DIAGNOSIS — M3210 Systemic lupus erythematosus, organ or system involvement unspecified: Secondary | ICD-10-CM | POA: Diagnosis not present

## 2018-10-24 DIAGNOSIS — N186 End stage renal disease: Secondary | ICD-10-CM | POA: Diagnosis not present

## 2018-10-24 DIAGNOSIS — R531 Weakness: Secondary | ICD-10-CM | POA: Diagnosis present

## 2018-10-24 LAB — CBC WITH DIFFERENTIAL/PLATELET
Abs Immature Granulocytes: 0.02 10*3/uL (ref 0.00–0.07)
Basophils Absolute: 0 10*3/uL (ref 0.0–0.1)
Basophils Relative: 0 %
Eosinophils Absolute: 0.2 10*3/uL (ref 0.0–0.5)
Eosinophils Relative: 3 %
HCT: 28.4 % — ABNORMAL LOW (ref 36.0–46.0)
Hemoglobin: 8.7 g/dL — ABNORMAL LOW (ref 12.0–15.0)
Immature Granulocytes: 0 %
Lymphocytes Relative: 20 %
Lymphs Abs: 1.4 10*3/uL (ref 0.7–4.0)
MCH: 27.9 pg (ref 26.0–34.0)
MCHC: 30.6 g/dL (ref 30.0–36.0)
MCV: 91 fL (ref 80.0–100.0)
Monocytes Absolute: 0.5 10*3/uL (ref 0.1–1.0)
Monocytes Relative: 7 %
Neutro Abs: 5 10*3/uL (ref 1.7–7.7)
Neutrophils Relative %: 70 %
Platelets: 168 10*3/uL (ref 150–400)
RBC: 3.12 MIL/uL — ABNORMAL LOW (ref 3.87–5.11)
RDW: 15 % (ref 11.5–15.5)
WBC: 7.2 10*3/uL (ref 4.0–10.5)
nRBC: 0 % (ref 0.0–0.2)

## 2018-10-24 LAB — I-STAT CHEM 8, ED
BUN: 59 mg/dL — ABNORMAL HIGH (ref 8–23)
Calcium, Ion: 0.92 mmol/L — ABNORMAL LOW (ref 1.15–1.40)
Chloride: 104 mmol/L (ref 98–111)
Creatinine, Ser: 12.5 mg/dL — ABNORMAL HIGH (ref 0.44–1.00)
Glucose, Bld: 81 mg/dL (ref 70–99)
HCT: 42 % (ref 36.0–46.0)
Hemoglobin: 14.3 g/dL (ref 12.0–15.0)
Potassium: 5.1 mmol/L (ref 3.5–5.1)
Sodium: 137 mmol/L (ref 135–145)
TCO2: 28 mmol/L (ref 22–32)

## 2018-10-24 LAB — TROPONIN I: Troponin I: 0.05 ng/mL (ref <0.03)

## 2018-10-24 NOTE — ED Triage Notes (Signed)
Per nursing home pt has been less reactive over the last 24 hrs pt is complaining of chest pain and right leg pain. Pt has a paced HR at 60.

## 2018-10-24 NOTE — ED Notes (Signed)
RN attempted IV

## 2018-10-24 NOTE — Discharge Instructions (Addendum)
You need to reschedule your dialysis as soon as you can. I understand you are not feeling well but there is no medical need to emergently give you dialysis today.

## 2018-10-24 NOTE — ED Triage Notes (Signed)
Pt missed dialysis on Saturday per facility

## 2018-10-24 NOTE — ED Notes (Signed)
Report given to nursing home and PTAR

## 2018-10-27 NOTE — ED Provider Notes (Signed)
[place] EMERGENCY DEPARTMENT Provider Note  CSN: [id number] Arrival date & time:     History   Chief Complaint Chief Complaint  Patient presents with  . Leg Pain    right  . Chest Pain   HPI Abigail Buck is a 73 y.o. female.  HPI   53yF with generalized weakness. Onset about 3d ago. Persistent since then. ESRD and missed HD on Saturday because wasn't feeling well. Triage note reviewed. Some pain in R leg but says has been ongoing for weeks. Denies trauma/strain. No fever. No v/d. Does make some urine and no acute complaints there. No dyspnea.   Past Medical History:  Diagnosis Date  . Anemia 07/2018   Transfused 2 PRBCs.  . ESRD (end stage renal disease) (HCC)    TTS HD  . Lupus [facility])    Patient Active Problem List   Diagnosis Date Noted  . Diverticulosis of colon without hemorrhage   . Lower GI bleed   . Hematochezia   . Acute blood loss anemia   . Platelet inhibition due to Plavix   . Acute GI bleeding 09/15/2018  . ESRD (end stage renal disease) ([place]) 08/08/2018  . History of cerebral infarction   . Chest pain 06/21/2018  . Lupus ([place])   . Elevated troponin   . Artificial pacemaker   . Right bundle branch block   . ESRD (end stage renal disease) on dialysis ([place]) 03/12/2017   Past Surgical History:  Procedure Laterality Date  . ABDOMINAL AORTOGRAM W/LOWER EXTREMITY Bilateral 08/08/2018   Procedure: ABDOMINAL AORTOGRAM W/LOWER EXTREMITY;  Surgeon: [name], MD;  Location: [place] CV LAB;  Service: Cardiovascular;  Laterality: Bilateral;  . ABDOMINAL HYSTERECTOMY  1986  . AMPUTATION Right 08/09/2018   Procedure: AMPUTATION RIGHT GREAT TOE;  Surgeon: Angelia Mould, MD;  Location: [place];  Service: Vascular;  Laterality: Right;  . COLONOSCOPY N/A 09/18/2018   Procedure: COLONOSCOPY;  Surgeon: Irene Shipper, MD;  Location: [facility] ENDOSCOPY;  Service: Endoscopy;  Laterality: N/A;  . PACEMAKER IMPLANT  10/08/2017   Done at  [place]    OB History   No obstetric history on file.    Home Medications    Prior to Admission medications   Medication Sig Start Date End Date Taking? Authorizing Provider  acetaminophen (TYLENOL) 325 MG tablet Take 650 mg by mouth every 4 (four) hours as needed (for pain).    Yes [provider]  ALPRAZolam (XANAX) 0.25 MG tablet Take 0.25 mg by mouth 2 (two) times daily.  10/14/17  Yes [provider]  carvedilol (COREG) 6.25 MG tablet Take 6.25 mg by mouth 2 (two) times daily with a meal.   Yes [provider]  clopidogrel (PLAVIX) 75 MG tablet Take 75 mg by mouth daily.   Yes [provider]  ergocalciferol (VITAMIN D2) 50000 units capsule Take 50,000 Units by mouth every Friday.    Yes [provider]  hydroxychloroquine (PLAQUENIL) 200 MG tablet Take 200 mg by mouth daily at 12 noon.    Yes [provider]  midodrine (PROAMATINE) 10 MG tablet Take 10 mg by mouth daily at 12 noon.    Yes [provider]  oxyCODONE (OXY IR/ROXICODONE) 5 MG immediate release tablet Take 1-2 tablets (5-10 mg total) by mouth every 4 (four) hours as needed for moderate pain. Patient taking differently: Take 5-10 mg by mouth every 4 (four) hours as needed for moderate pain. Give 5mg  for pain level 1-5  pain ; Give 10mg  for pain level 6-10 pain. 08/10/18  Yes Dagoberto Ligas, PA-C  sevelamer carbonate (RENVELA) 800 MG tablet Take 800 mg by mouth 3 (three) times daily with meals.   Yes [provider]   Family History Family History  Problem Relation Age of Onset  . Stroke Mother   . Hypertension Mother   . Heart murmur Father    Social History Social History   Tobacco Use  . Smoking status: Never Smoker  . Smokeless tobacco: Never Used  Substance Use Topics  . Alcohol use: No  . Drug use: No   Allergies   Morphine and Clindamycin/lincomycin  Review of Systems Review of Systems  All systems reviewed and negative, other than  as noted in HPI.  Physical Exam Updated Vital Signs BP 108/68   Pulse 61   Temp 97.9 F (36.6 C) (Oral)   Resp 16   SpO2 95%   Physical Exam Vitals signs and nursing note reviewed.  Constitutional:      General: She is not in acute distress.    Appearance: She is well-developed.     Comments: Laying in bed. appears tired but not toxic.   HENT:     Head: Normocephalic and atraumatic.  Eyes:     General:        Right eye: No discharge.        Left eye: No discharge.     Conjunctiva/sclera: Conjunctivae normal.  Neck:     Musculoskeletal: Neck supple.  Cardiovascular:     Rate and Rhythm: Normal rate and regular rhythm.     Heart sounds: Normal heart sounds. No murmur. No friction rub. No gallop.   Pulmonary:     Effort: Pulmonary effort is normal. No respiratory distress.     Breath sounds: Normal breath sounds.  Abdominal:     General: There is no distension.     Palpations: Abdomen is soft.     Tenderness: There is no abdominal tenderness.  Musculoskeletal:        General: No tenderness.     Comments: Amputation R big toe looks good. Small piece of suture material still present and removed.   Skin:    General: Skin is warm and dry.  Neurological:     Mental Status: She is alert.  Psychiatric:        Behavior: Behavior normal.        Thought Content: Thought content normal.    ED Treatments / Results  Labs (all labs ordered are listed, but only abnormal results are displayed) Labs Reviewed  TROPONIN I - Abnormal; Notable for the following components:      Result Value   Troponin I 0.05 (*)    All other components within normal limits  CBC WITH DIFFERENTIAL/PLATELET - Abnormal; Notable for the following components:   RBC 3.12 (*)    Hemoglobin 8.7 (*)    HCT 28.4 (*)    All other components within normal limits  I-STAT CHEM 8, ED - Abnormal; Notable for the following components:   BUN 59 (*)    Creatinine, Ser 12.50 (*)    Calcium, Ion 0.92 (*)    All  other components within normal limits   EKG EKG Interpretation  Date/Time:  Monday Oct 24 2018 11:03:55 EDT Ventricular Rate:  60 PR Interval:    QRS Duration: 168 QT Interval:  541 QTC Calculation: 541 R Axis:   -77 Text Interpretation:  VENTRICULAR PACED RHYTHM Confirmed by Wilson Singer,  Annie Main [phone]) on 10/24/2018 12:56:42 PM  Radiology No results found.   Dg Chest Portable 1 View  Result Date: 10/24/2018 CLINICAL DATA:  Chest and right leg pain. EXAM: PORTABLE CHEST 1 VIEW COMPARISON:  Radiographs 09/30/2018 and 06/21/2018. FINDINGS: 1200 hours. Left IJ hemodialysis catheter and right subclavian pacemaker leads appear unchanged. There is stable cardiomegaly and aortic atherosclerosis. Calcified left hilar lymph nodes are present. There is no edema, confluent airspace opacity, pleural effusion or pneumothorax. No acute osseous findings are evident. IMPRESSION: Stable chest without evidence of acute cardiopulmonary process. Electronically Signed   By: Richardean Sale M.D.   On: 10/24/2018 12:19   Dg Chest Port 1 View  Result Date: 09/30/2018 CLINICAL DATA:  Fever EXAM: PORTABLE CHEST 1 VIEW COMPARISON:  June 21, 2018. FINDINGS: Central catheter tip is in the right atrium. No pneumothorax. There is no edema or consolidation. Heart is borderline enlarged with pulmonary vascularity normal. Pacemaker leads are attached to the right atrium and right ventricle. There are multiple calcified lymph nodes consistent with prior granulomatous disease. No enlarged lymph nodes evident. There is aortic atherosclerosis. No bone lesions. IMPRESSION: Central catheter tip in right atrium. No edema or consolidation. Stable cardiac prominence. Stable pacemaker lead placement. Aortic Atherosclerosis (ICD10-I70.0). Electronically Signed   By: Lowella Grip III M.D.   On: 09/30/2018 14:14   Dg Foot Complete Right  Result Date: 09/30/2018 CLINICAL DATA:  Intermittent fevers.  Recent great toe amputation. EXAM:  RIGHT FOOT COMPLETE - 3+ VIEW COMPARISON:  None. FINDINGS: Postsurgical changes related to prior great toe amputation to the level of the mid first proximal phalanx. Mild soft tissue irregularity at the amputation stump. No acute fracture or dislocation. No osseous destruction or periosteal reaction. Joint spaces are preserved. Diffuse osteopenia. Vascular calcifications. IMPRESSION: 1. Prior great toe amputation with mild soft tissue irregularity at the amputation stump. Correlate with physical exam. 2.  No acute osseous abnormality. Electronically Signed   By: Titus Dubin M.D.   On: 09/30/2018 14:17     Procedures Procedures (including critical care time)  Medications Ordered in ED Medications - No data to display  Initial Impression / Assessment and Plan / ED Course  I have reviewed the triage vital signs and the nursing notes.  Pertinent labs & imaging results that were available during my care of the patient were reviewed by me and considered in my medical decision making (see chart for details).  72yF with generalized weakness. Doesn't appear to be emergent condition. W/u fairly unremarkable with regards to acute process. No indication for emergent HD. Back to facility.  Final Clinical Impressions(s) / ED Diagnoses   Final diagnoses:  Generalized weakness  ESRD (end stage renal disease) [facility])    ED Discharge Orders    None       Virgel Manifold, MD 10/27/18 1257

## 2018-11-29 ENCOUNTER — Encounter (HOSPITAL_BASED_OUTPATIENT_CLINIC_OR_DEPARTMENT_OTHER): Payer: Self-pay | Admitting: *Deleted

## 2018-11-29 ENCOUNTER — Emergency Department (HOSPITAL_BASED_OUTPATIENT_CLINIC_OR_DEPARTMENT_OTHER): Payer: Medicare Other

## 2018-11-29 ENCOUNTER — Emergency Department (HOSPITAL_BASED_OUTPATIENT_CLINIC_OR_DEPARTMENT_OTHER)
Admission: EM | Admit: 2018-11-29 | Discharge: 2018-11-29 | Disposition: A | Discharge status: Home / Self Care | Payer: Medicare Other | Attending: Emergency Medicine | Admitting: Emergency Medicine

## 2018-11-29 ENCOUNTER — Other Ambulatory Visit: Payer: Self-pay

## 2018-11-29 DIAGNOSIS — M79604 Pain in right leg: Secondary | ICD-10-CM | POA: Diagnosis not present

## 2018-11-29 DIAGNOSIS — R52 Pain, unspecified: Secondary | ICD-10-CM

## 2018-11-29 DIAGNOSIS — I132 Hypertensive heart and chronic kidney disease with heart failure and with stage 5 chronic kidney disease, or end stage renal disease: Secondary | ICD-10-CM | POA: Insufficient documentation

## 2018-11-29 DIAGNOSIS — M79605 Pain in left leg: Secondary | ICD-10-CM | POA: Insufficient documentation

## 2018-11-29 DIAGNOSIS — R509 Fever, unspecified: Secondary | ICD-10-CM | POA: Insufficient documentation

## 2018-11-29 DIAGNOSIS — Z79899 Other long term (current) drug therapy: Secondary | ICD-10-CM | POA: Diagnosis not present

## 2018-11-29 DIAGNOSIS — F0390 Unspecified dementia without behavioral disturbance: Secondary | ICD-10-CM | POA: Insufficient documentation

## 2018-11-29 DIAGNOSIS — N186 End stage renal disease: Secondary | ICD-10-CM | POA: Insufficient documentation

## 2018-11-29 DIAGNOSIS — R634 Abnormal weight loss: Secondary | ICD-10-CM | POA: Diagnosis present

## 2018-11-29 DIAGNOSIS — Z992 Dependence on renal dialysis: Secondary | ICD-10-CM | POA: Insufficient documentation

## 2018-11-29 DIAGNOSIS — I509 Heart failure, unspecified: Secondary | ICD-10-CM | POA: Diagnosis not present

## 2018-11-29 DIAGNOSIS — R531 Weakness: Secondary | ICD-10-CM | POA: Diagnosis not present

## 2018-11-29 LAB — CBC WITH DIFFERENTIAL/PLATELET
Abs Immature Granulocytes: 0.02 10*3/uL (ref 0.00–0.07)
Basophils Absolute: 0 10*3/uL (ref 0.0–0.1)
Basophils Relative: 0 %
Eosinophils Absolute: 0 10*3/uL (ref 0.0–0.5)
Eosinophils Relative: 1 %
HCT: 38.2 % (ref 36.0–46.0)
Hemoglobin: 11.3 g/dL — ABNORMAL LOW (ref 12.0–15.0)
Immature Granulocytes: 0 %
Lymphocytes Relative: 17 %
Lymphs Abs: 0.9 10*3/uL (ref 0.7–4.0)
MCH: 26.3 pg (ref 26.0–34.0)
MCHC: 29.6 g/dL — ABNORMAL LOW (ref 30.0–36.0)
MCV: 89 fL (ref 80.0–100.0)
Monocytes Absolute: 0.4 10*3/uL (ref 0.1–1.0)
Monocytes Relative: 7 %
Neutro Abs: 4 10*3/uL (ref 1.7–7.7)
Neutrophils Relative %: 75 %
Platelets: 104 10*3/uL — ABNORMAL LOW (ref 150–400)
RBC: 4.29 MIL/uL (ref 3.87–5.11)
RDW: 16.5 % — ABNORMAL HIGH (ref 11.5–15.5)
Smear Review: NORMAL
WBC: 5.4 10*3/uL (ref 4.0–10.5)
nRBC: 0 % (ref 0.0–0.2)

## 2018-11-29 LAB — COMPREHENSIVE METABOLIC PANEL
ALT: 10 U/L (ref 0–44)
AST: 16 U/L (ref 15–41)
Albumin: 3.4 g/dL — ABNORMAL LOW (ref 3.5–5.0)
Alkaline Phosphatase: 59 U/L (ref 38–126)
Anion gap: 14 (ref 5–15)
BUN: 15 mg/dL (ref 8–23)
CO2: 28 mmol/L (ref 22–32)
Calcium: 10.1 mg/dL (ref 8.9–10.3)
Chloride: 96 mmol/L — ABNORMAL LOW (ref 98–111)
Creatinine, Ser: 8.38 mg/dL — ABNORMAL HIGH (ref 0.44–1.00)
GFR calc Af Amer: 5 mL/min — ABNORMAL LOW (ref >60)
GFR calc non Af Amer: 4 mL/min — ABNORMAL LOW (ref >60)
Glucose, Bld: 89 mg/dL (ref 70–99)
Potassium: 3.3 mmol/L — ABNORMAL LOW (ref 3.5–5.1)
Sodium: 138 mmol/L (ref 135–145)
Total Bilirubin: 0.9 mg/dL (ref 0.3–1.2)
Total Protein: 7.5 g/dL (ref 6.5–8.1)

## 2018-11-29 LAB — TSH: TSH: 1.212 u[IU]/mL (ref 0.350–4.500)

## 2018-11-29 MED ORDER — ACETAMINOPHEN 325 MG PO TABS: 650.0000 mg | ORAL_TABLET | Freq: Four times a day (QID) | ORAL | Status: DC | PRN

## 2018-11-29 MED ORDER — HYDROCODONE-ACETAMINOPHEN 5-325 MG PO TABS
1.0000 | ORAL_TABLET | Freq: Once | ORAL | Status: AC
  Administered 2018-11-29: 1 via ORAL
  Filled 2018-11-29: qty 1

## 2018-11-29 NOTE — ED Provider Notes (Signed)
[place] EMERGENCY DEPARTMENT Provider Note   CSN: [id number] Arrival date & time: 11/29/18  8295    History   Chief Complaint Chief Complaint  Patient presents with  . Weakness    HPI Abigail Buck is a 73 y.o. female with PMH significant for lupus, ESRD on HD TTS, anemia, pacemaker, CAD, CHF, dementia, HTN sent via EMS from HD due to 2 lb weight loss in the last week, generalized weakness and desaturation to 88% when upset. Per daughter was told she was unresponsive, hypertensive and had a low grade fever at HD. States she has sharp L thigh pain when she touches or moves her leg that started suddenly last night. Denies falls or trauma. She is wheelchair bound, is a resident at [facility] in [place]. She did not complete dialysis today, last HD on Saturday. Per chart notes, she was also c/o leg pain at HD 6/18, received tylenol with relief, able to complete HD at that time. Denies abdominal pain, N/V, shortness of breath. Per daughter, had R great toe amputation in March 2020. Since then has been working with PT to stretch leg after having in a boot for some time but patient has been scared to move it due to pain with stretching. Daughter denies any recent medication changes but does note she has been refusing meals at [facility] lately as she is not interested in the food. Daughter denies any knowledge of recent illness or N/V but does report one of the staff members at her facility tested positive for COVID.  [place]     Past Medical History:  Diagnosis Date  . Anemia 07/2018   Transfused 2 PRBCs.  . ESRD (end stage renal disease) (HCC)    TTS HD  . Lupus [facility])     Patient Active Problem List   Diagnosis Date Noted  . Diverticulosis of colon without hemorrhage   . Lower GI bleed   . Hematochezia   . Acute blood loss anemia   . Platelet inhibition due to Plavix   . Acute GI bleeding 09/15/2018  . ESRD (end stage renal disease)  ([place]) 08/08/2018  . History of cerebral infarction   . Chest pain 06/21/2018  . Lupus ([place])   . Elevated troponin   . Artificial pacemaker   . Right bundle branch block   . ESRD (end stage renal disease) on dialysis ([place]) 03/12/2017    Past Surgical History:  Procedure Laterality Date  . ABDOMINAL AORTOGRAM W/LOWER EXTREMITY Bilateral 08/08/2018   Procedure: ABDOMINAL AORTOGRAM W/LOWER EXTREMITY;  Surgeon: [name], MD;  Location: [place] CV LAB;  Service: Cardiovascular;  Laterality: Bilateral;  . ABDOMINAL HYSTERECTOMY  1986  . AMPUTATION Right 08/09/2018   Procedure: AMPUTATION RIGHT GREAT TOE;  Surgeon: Angelia Mould, MD;  Location: [place];  Service: Vascular;  Laterality: Right;  . COLONOSCOPY N/A 09/18/2018   Procedure: COLONOSCOPY;  Surgeon: Irene Shipper, MD;  Location: [facility] ENDOSCOPY;  Service: Endoscopy;  Laterality: N/A;  . PACEMAKER IMPLANT  10/08/2017   Done at [place]     OB History   No obstetric history on file.     Home Medications    Prior to Admission medications   Medication Sig Start Date End Date Taking? Authorizing Provider  acetaminophen (TYLENOL) 325 MG tablet Take 650 mg by mouth every 4 (four) hours as needed (for pain).     [provider]  ALPRAZolam Duanne Moron) 0.25 MG tablet Take 0.25 mg  by mouth 2 (two) times daily.  10/14/17   [provider]  carvedilol (COREG) 6.25 MG tablet Take 6.25 mg by mouth 2 (two) times daily with a meal.    [provider]  clopidogrel (PLAVIX) 75 MG tablet Take 75 mg by mouth daily.    [provider]  ergocalciferol (VITAMIN D2) 50000 units capsule Take 50,000 Units by mouth every Friday.     [provider]  hydroxychloroquine (PLAQUENIL) 200 MG tablet Take 200 mg by mouth daily at 12 noon.     [provider]  midodrine (PROAMATINE) 10 MG tablet Take 10 mg by mouth daily at 12 noon.     [provider]  oxyCODONE (OXY IR/ROXICODONE) 5 MG  immediate release tablet Take 1-2 tablets (5-10 mg total) by mouth every 4 (four) hours as needed for moderate pain. Patient taking differently: Take 5-10 mg by mouth every 4 (four) hours as needed for moderate pain. Give 5mg  for pain level 1-5 pain ; Give 10mg  for pain level 6-10 pain. 08/10/18   Dagoberto Ligas, PA-C  sevelamer carbonate (RENVELA) 800 MG tablet Take 800 mg by mouth 3 (three) times daily with meals.    [provider]    Family History Family History  Problem Relation Age of Onset  . Stroke Mother   . Hypertension Mother   . Heart murmur Father     Social History Social History   Tobacco Use  . Smoking status: Never Smoker  . Smokeless tobacco: Never Used  Substance Use Topics  . Alcohol use: No  . Drug use: No    Allergies   Morphine and Clindamycin/lincomycin   Review of Systems Review of Systems  Constitutional: Negative for appetite change and fever.  HENT: Negative for sore throat.   Respiratory: Negative for cough and shortness of breath.   Gastrointestinal: Negative for abdominal pain, blood in stool, nausea and vomiting.   Physical Exam Updated Vital Signs BP (!) 125/96 (BP Location: Right Arm)   Pulse 75   Temp 98.2 F (36.8 C) (Oral)   Resp 18   SpO2 96%   Physical Exam Constitutional:      Appearance: She is obese. She is not toxic-appearing or diaphoretic.  HENT:     Head: Normocephalic.  Cardiovascular:     Rate and Rhythm: Normal rate and regular rhythm.     Heart sounds: Normal heart sounds. No murmur.  Pulmonary:     Effort: Pulmonary effort is normal.     Comments: Difficult to appreciate breath sounds 2/2 constant moaning. Appropriate saturations and normal WOB on RA. Abdominal:     General: Bowel sounds are normal.     Palpations: Abdomen is soft.     Tenderness: There is no abdominal tenderness. There is no guarding or rebound.  Musculoskeletal:        General: No swelling.     Right lower leg: No edema.      Left lower leg: No edema.     Comments: AROM and PROM greatly limited 2/2 pain. No obvious deformities, step offs. 2+ DP pulses bilaterally. R great toe amputated without erythema, discharge, or swelling.   Neurological:     Mental Status: She is alert.     ED Treatments / Results  Labs (all labs ordered are listed, but only abnormal results are displayed) Labs Reviewed  COMPREHENSIVE METABOLIC PANEL - Abnormal; Notable for the following components:      Result Value   Potassium 3.3 (*)  Chloride 96 (*)    Creatinine, Ser 8.38 (*)    Albumin 3.4 (*)    GFR calc non Af Amer 4 (*)    GFR calc Af Amer 5 (*)    All other components within normal limits  CBC WITH DIFFERENTIAL/PLATELET - Abnormal; Notable for the following components:   Hemoglobin 11.3 (*)    MCHC 29.6 (*)    RDW 16.5 (*)    Platelets 104 (*)    All other components within normal limits  TSH    EKG None  Radiology Dg Chest 2 View  Result Date: 11/29/2018 CLINICAL DATA:  Altered mental status today. EXAM: CHEST - 2 VIEW COMPARISON:  Single view of the chest 10/24/2018. FINDINGS: Pacing device and dialysis catheter again seen. Calcified granulomas in the left lung and calcified left hilar lymph nodes are again seen. No consolidative process, pulmonary edema, pneumothorax or pleural effusion. Atherosclerosis noted. Heart size is normal. No acute or focal bony abnormality. IMPRESSION: No acute disease. Atherosclerosis. Electronically Signed   By: Inge Rise M.D.   On: 11/29/2018 10:03   Dg Pelvis 1-2 Views  Result Date: 11/29/2018 CLINICAL DATA:  Right groin pain EXAM: PELVIS - 1-2 VIEW COMPARISON:  None. FINDINGS: Limited rotated pelvis with no evidence of fracture or dislocation. Prominent osteopenia and diffuse arterial calcification. IMPRESSION: Limited study due to rotation and osteopenia. No acute finding. Electronically Signed   By: Monte Fantasia M.D.   On: 11/29/2018 10:03   Dg Tibia/fibula Left   Result Date: 11/29/2018 CLINICAL DATA:  Altered mental status.  Right groin pain. EXAM: LEFT TIBIA AND FIBULA - 2 VIEW COMPARISON:  None. FINDINGS: Osteopenia. Extensive arterial calcification. No fracture, erosion or malalignment. The knee is included on femur study. IMPRESSION: No acute finding. Electronically Signed   By: Monte Fantasia M.D.   On: 11/29/2018 10:03   Dg Tibia/fibula Right  Result Date: 11/29/2018 CLINICAL DATA:  73 year old female with altered mental status and lower extremity pain. EXAM: RIGHT TIBIA AND FIBULA - 2 VIEW COMPARISON:  Right femur series today reported separately. Right foot series 09/30/2018. FINDINGS: Calcified peripheral vascular disease. Stable alignment at the right ankle. The right knee is only partially visible. The medial plateau of the right tibia was included on the femur series today. The visible right tibia and fibula appear intact. Osteopenia. No discrete soft tissue injury. IMPRESSION: 1. No acute fracture or dislocation identified about the right tib-fib. 2. Calcified peripheral vascular disease. Electronically Signed   By: Genevie Ann M.D.   On: 11/29/2018 10:06   Dg Femur Min 2 Views Left  Result Date: 11/29/2018 CLINICAL DATA:  Altered mental status.  Right groin pain. EXAM: LEFT FEMUR 2 VIEWS COMPARISON:  None. FINDINGS: There is no evidence of fracture or other focal bone lesions. Osteopenia and atherosclerosis. IMPRESSION: No acute finding in the left femur. Electronically Signed   By: Monte Fantasia M.D.   On: 11/29/2018 10:02   Dg Femur, Min 2 Views Right  Result Date: 11/29/2018 CLINICAL DATA:  Altered mental status.  Right groin pain. EXAM: RIGHT FEMUR 2 VIEWS COMPARISON:  09/15/2018. FINDINGS: Diffuse osteopenia and degenerative change. No acute bony abnormality. No evidence of fracture. Peripheral vascular calcification. IMPRESSION: 1.  Diffuse osteopenia degenerative change.  No acute abnormality. 2.  Peripheral vascular disease. Electronically  Signed   By: Marcello Moores  Register   On: 11/29/2018 10:05   Procedures Procedures (including critical care time)  Medications Ordered in ED Medications  HYDROcodone-acetaminophen (NORCO/VICODIN) 5-325  MG per tablet 1 tablet (1 tablet Oral Given 11/29/18 0944)    Initial Impression / Assessment and Plan / ED Course  I have reviewed the triage vital signs and the nursing notes.  Pertinent labs & imaging results that were available during my care of the patient were reviewed by me and considered in my medical decision making (see chart for details).   73yo F with h/o lupus, ESRD on HD TTS, anemia, pacemaker, CAD, CHF, dementia, HTN sent from HD via EMS for generalized weakness, desaturation when upset and 2lb weight loss in the last week, also c/o sharp L leg pain. Vitals stable. Normal WOB and appropriate saturations on RA. R and L thigh pain exacerbated by movement, no obvious deformities, stepoffs, bruises, rashes, wounds appreciated. ROM greatly limited by pain. 2+ DP pulses bilaterally. Is at risk for occult fracture given immobility and chronically debilitated state even though she denies falls or trauma. Bilateral femur, hip, and tib/fib XR with osteopenia but negative for fracture or dislocation. CXR clear. CBC without signs of infection or worsened anemia. CMP with slight hypokalemia but without hepatic abnormality or uremia. No urgent indication for dialysis or admission.  On reevaluation, leg pain relieved with norco. Believe leg pain is secondary to ongoing debilitation and immobility as previously stated by daughter given negative XR, recommend continuing to work with PT. Unclear etiology with broad differential for weight loss, most likely related to decreased food intake with recent loss of appetite. No signs of infection, uremia, hepatic abnormality. No red flags for occult cancer on history, exam or imaging. Would recommend further workup with PCP with age-appropriate cancer screenings.  Overall, stable for discharge with close PCP and nephrology follow up.    Final Clinical Impressions(s) / ED Diagnoses   Final diagnoses:  Generalized weakness  Pain in both lower extremities    ED Discharge Orders    None       Rory Percy, DO 11/29/18 1055    Elnora Morrison, MD 11/29/18 1440

## 2018-11-29 NOTE — TOC Initial Note (Addendum)
Transition of Care [facility]) - Initial/Assessment Note    Patient Details  Name: Abigail Buck MRN: [id number] Date of Birth: 1945/06/30  Transition of Care [facility]) CM/SW Contact:    Erenest Rasher, RN Phone Number: 11/29/2018, 1:49 PM  Clinical Narrative:    3 ED visits/3 IP visits in 6 months/Home Health  Spoke to dtr, Carrington Clamp. Pt is currently at the [facility]. States pt has PT coming in and the facility has a Therapist, sports available. States pt has been to rehab but at this point she does not meet criteria to return to rehab at facility. Dtr states if pt had her dentures she would eat more. States the dentures have been misplaced and her sister plans to file a report. Explained she can contact the dentist that provided dentures to see if they can replace with old impressions. Pt has a wheelchair. States they were able to bring her food in the past but due to COVID 19 they are not able to bring food. HH is provided at the ALF.              Expected Discharge Plan: Assisted Living Barriers to Discharge: No Barriers Identified   Patient Goals and CMS Choice Patient states their goals for this hospitalization and ongoing recovery are:: wants her to be able to eat   Choice offered to / list presented to : Adult Children  Expected Discharge Plan and Services Expected Discharge Plan: Assisted Living   Discharge Planning Services: CM Consult Post Acute Care Choice: Home Health                                        Prior Living Arrangements/Services   Lives with:: Facility Resident Patient language and need for interpreter reviewed:: Yes Do you feel safe going back to the place where you live?: Yes      Need for Family Participation in Patient Care: Yes (Comment) Care giver support system in place?: Yes (comment)   Criminal Activity/Legal Involvement Pertinent to Current Situation/Hospitalization: No - Comment as needed  Activities of Daily Living      Permission  Sought/Granted Permission sought to share information with : Case Manager, Customer service manager, Family Supports Permission granted to share information with : Yes, Verbal Permission Granted              Emotional Assessment       Orientation: : Oriented to Self   Psych Involvement: No (comment)  Admission diagnosis:  WEAKNESS Patient Active Problem List   Diagnosis Date Noted  . Diverticulosis of colon without hemorrhage   . Lower GI bleed   . Hematochezia   . Acute blood loss anemia   . Platelet inhibition due to Plavix   . Acute GI bleeding 09/15/2018  . ESRD (end stage renal disease) ([place]) 08/08/2018  . History of cerebral infarction   . Chest pain 06/21/2018  . Lupus ([place])   . Elevated troponin   . Artificial pacemaker   . Right bundle branch block   . ESRD (end stage renal disease) on dialysis ([place]) 03/12/2017   PCP:  System, Pcp Not In Pharmacy:  No Pharmacies Listed    Social Determinants of Health (SDOH) Interventions    Readmission Risk Interventions Readmission Risk Prevention Plan 09/19/2018  Transportation Screening Complete  PCP or Specialist Appt within 5-7 Days Complete  Home Care Screening Complete  Medication Review (  RN CM) Complete

## 2018-11-29 NOTE — ED Triage Notes (Addendum)
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

## 2018-11-29 NOTE — Discharge Instructions (Addendum)
You were seen today for leg pain and weakness. Your xrays did not show evidence of fracture or dislocation. Your labs did not show any evidence of infection. You are stable for discharge back to [facility].

## 2018-11-29 NOTE — ED Notes (Signed)
Attempted to call [facility] to give report  No answer

## 2018-11-29 NOTE — ED Notes (Signed)
Pos bilateral pedal pulses by doppler

## 2018-11-29 NOTE — ED Notes (Signed)
Patient transported to X-ray 

## 2019-01-07 DEATH — deceased

## 2021-02-04 IMAGING — DX PELVIS - 1-2 VIEW
1 series · 1 of 1 positions shown · non-contrast
Comparison: None.

CLINICAL DATA: Right groin pain

EXAM:
PELVIS - 1-2 VIEW

[pelvis ap]
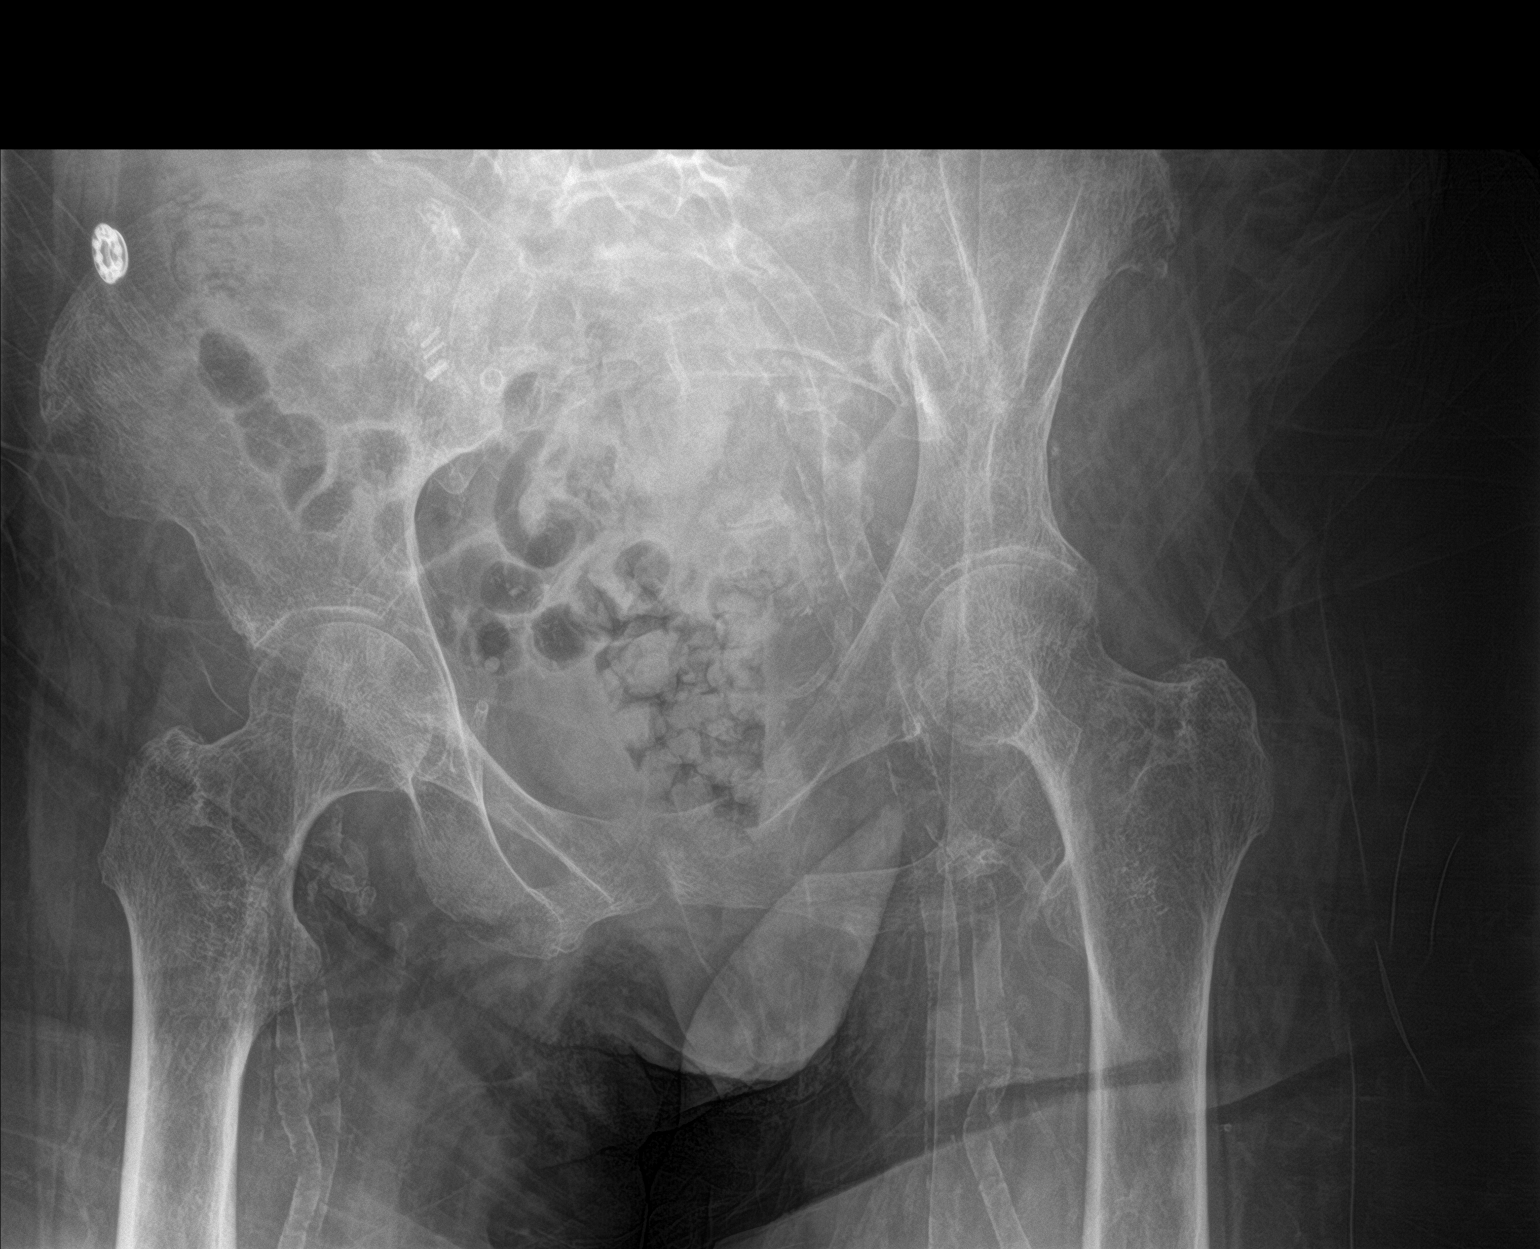

[1 of 1 positions shown; findings below may reference images not displayed]

FINDINGS: Limited rotated pelvis with no evidence of fracture or dislocation.
Prominent osteopenia and diffuse arterial calcification.
IMPRESSION: Limited study due to rotation and osteopenia.

No acute finding.
